# Patient Record
Sex: Male | Born: 1965 | Race: White | Hispanic: No | Smoking: Never smoker
Health system: Southern US, Community
[De-identification: ages and names within clinical notes are randomized; demographics above are authoritative.]

## PROBLEM LIST (undated history)

## (undated) DIAGNOSIS — I82409 Acute embolism and thrombosis of unspecified deep veins of unspecified lower extremity: Secondary | ICD-10-CM

## (undated) DIAGNOSIS — K219 Gastro-esophageal reflux disease without esophagitis: Secondary | ICD-10-CM

## (undated) DIAGNOSIS — G4730 Sleep apnea, unspecified: Secondary | ICD-10-CM

## (undated) DIAGNOSIS — J329 Chronic sinusitis, unspecified: Secondary | ICD-10-CM

## (undated) DIAGNOSIS — K449 Diaphragmatic hernia without obstruction or gangrene: Secondary | ICD-10-CM

## (undated) DIAGNOSIS — E119 Type 2 diabetes mellitus without complications: Secondary | ICD-10-CM

## (undated) DIAGNOSIS — Z9889 Other specified postprocedural states: Secondary | ICD-10-CM

## (undated) DIAGNOSIS — T8859XA Other complications of anesthesia, initial encounter: Secondary | ICD-10-CM

## (undated) DIAGNOSIS — C801 Malignant (primary) neoplasm, unspecified: Secondary | ICD-10-CM

## (undated) DIAGNOSIS — R112 Nausea with vomiting, unspecified: Secondary | ICD-10-CM

## (undated) DIAGNOSIS — T4145XA Adverse effect of unspecified anesthetic, initial encounter: Secondary | ICD-10-CM

## (undated) DIAGNOSIS — R7303 Prediabetes: Secondary | ICD-10-CM

## (undated) DIAGNOSIS — I10 Essential (primary) hypertension: Secondary | ICD-10-CM

## (undated) HISTORY — PX: SHOULDER ARTHROSCOPY: SHX128

## (undated) HISTORY — DX: Gastro-esophageal reflux disease without esophagitis: K21.9

## (undated) HISTORY — PX: KNEE ARTHROSCOPY: SUR90

## (undated) HISTORY — DX: Chronic sinusitis, unspecified: J32.9

## (undated) HISTORY — DX: Essential (primary) hypertension: I10

## (undated) HISTORY — PX: JOINT REPLACEMENT: SHX530

## (undated) HISTORY — PX: OTHER SURGICAL HISTORY: SHX169

## (undated) HISTORY — DX: Sleep apnea, unspecified: G47.30

## (undated) HISTORY — DX: Diaphragmatic hernia without obstruction or gangrene: K44.9

---

## 2005-05-21 ENCOUNTER — Emergency Department (HOSPITAL_COMMUNITY): Admission: EM | Admit: 2005-05-21 | Discharge: 2005-05-22 | Payer: Self-pay | Admitting: Emergency Medicine

## 2005-05-22 ENCOUNTER — Inpatient Hospital Stay (HOSPITAL_COMMUNITY): Admission: AD | Admit: 2005-05-22 | Discharge: 2005-05-25 | Payer: Self-pay | Admitting: Otolaryngology

## 2009-11-22 ENCOUNTER — Ambulatory Visit: Payer: Self-pay | Admitting: Family Medicine

## 2009-11-22 DIAGNOSIS — J309 Allergic rhinitis, unspecified: Secondary | ICD-10-CM | POA: Insufficient documentation

## 2009-11-22 DIAGNOSIS — I10 Essential (primary) hypertension: Secondary | ICD-10-CM | POA: Insufficient documentation

## 2009-11-22 DIAGNOSIS — I2699 Other pulmonary embolism without acute cor pulmonale: Secondary | ICD-10-CM | POA: Insufficient documentation

## 2009-11-23 ENCOUNTER — Telehealth: Payer: Self-pay | Admitting: Family Medicine

## 2009-11-29 ENCOUNTER — Telehealth: Payer: Self-pay | Admitting: Family Medicine

## 2009-12-03 ENCOUNTER — Ambulatory Visit: Payer: Self-pay | Admitting: Family Medicine

## 2009-12-03 LAB — CONVERTED CEMR LAB
INR: 1
Prothrombin Time: 12.1 s

## 2009-12-06 ENCOUNTER — Ambulatory Visit: Payer: Self-pay | Admitting: Family Medicine

## 2009-12-06 LAB — CONVERTED CEMR LAB
INR: 1.4
Prothrombin Time: 14.4 s

## 2009-12-08 ENCOUNTER — Telehealth: Payer: Self-pay | Admitting: Family Medicine

## 2009-12-08 ENCOUNTER — Ambulatory Visit: Payer: Self-pay | Admitting: Family Medicine

## 2009-12-08 ENCOUNTER — Encounter: Payer: Self-pay | Admitting: Family Medicine

## 2009-12-08 ENCOUNTER — Encounter (INDEPENDENT_AMBULATORY_CARE_PROVIDER_SITE_OTHER): Payer: Self-pay | Admitting: *Deleted

## 2009-12-08 DIAGNOSIS — I82409 Acute embolism and thrombosis of unspecified deep veins of unspecified lower extremity: Secondary | ICD-10-CM | POA: Insufficient documentation

## 2009-12-08 LAB — CONVERTED CEMR LAB
INR: 2.1
Prothrombin Time: 17.9 s

## 2009-12-13 ENCOUNTER — Ambulatory Visit: Payer: Self-pay | Admitting: Family Medicine

## 2009-12-13 LAB — CONVERTED CEMR LAB
INR: 2.2
Prothrombin Time: 18.2 s

## 2009-12-14 ENCOUNTER — Ambulatory Visit: Payer: Self-pay | Admitting: Pulmonary Disease

## 2009-12-14 DIAGNOSIS — R0989 Other specified symptoms and signs involving the circulatory and respiratory systems: Secondary | ICD-10-CM | POA: Insufficient documentation

## 2009-12-14 DIAGNOSIS — R0609 Other forms of dyspnea: Secondary | ICD-10-CM

## 2009-12-15 ENCOUNTER — Ambulatory Visit: Payer: Self-pay | Admitting: Pulmonary Disease

## 2009-12-15 DIAGNOSIS — G4733 Obstructive sleep apnea (adult) (pediatric): Secondary | ICD-10-CM | POA: Insufficient documentation

## 2009-12-27 ENCOUNTER — Ambulatory Visit: Payer: Self-pay | Admitting: Family Medicine

## 2009-12-27 LAB — CONVERTED CEMR LAB
INR: 3.6
Prothrombin Time: 23.1 s

## 2010-01-10 ENCOUNTER — Ambulatory Visit: Payer: Self-pay | Admitting: Family Medicine

## 2010-01-10 LAB — CONVERTED CEMR LAB: INR: 2.2

## 2010-01-31 ENCOUNTER — Ambulatory Visit: Payer: Self-pay | Admitting: Family Medicine

## 2010-02-01 ENCOUNTER — Telehealth (INDEPENDENT_AMBULATORY_CARE_PROVIDER_SITE_OTHER): Payer: Self-pay | Admitting: *Deleted

## 2010-02-28 ENCOUNTER — Ambulatory Visit: Payer: Self-pay | Admitting: Family Medicine

## 2010-02-28 LAB — CONVERTED CEMR LAB: INR: 1.8

## 2010-03-30 ENCOUNTER — Ambulatory Visit: Payer: Self-pay | Admitting: Family Medicine

## 2010-03-30 LAB — CONVERTED CEMR LAB: INR: 1.5

## 2010-04-13 ENCOUNTER — Ambulatory Visit: Payer: Self-pay | Admitting: Family Medicine

## 2010-04-13 LAB — CONVERTED CEMR LAB: INR: 1.9

## 2010-05-09 ENCOUNTER — Ambulatory Visit: Payer: Self-pay | Admitting: Family Medicine

## 2010-05-09 LAB — CONVERTED CEMR LAB: INR: 2

## 2010-06-15 ENCOUNTER — Ambulatory Visit: Payer: Self-pay | Admitting: Family Medicine

## 2010-09-18 LAB — CONVERTED CEMR LAB
ALT: 24 units/L (ref 0–53)
AST: 19 units/L (ref 0–37)
Albumin: 3.8 g/dL (ref 3.5–5.2)
Alkaline Phosphatase: 65 units/L (ref 39–117)
BUN: 18 mg/dL (ref 6–23)
Basophils Absolute: 0.1 10*3/uL (ref 0.0–0.1)
Basophils Relative: 1.1 % (ref 0.0–3.0)
Bilirubin Urine: NEGATIVE
Bilirubin, Direct: 0.1 mg/dL (ref 0.0–0.3)
Blood in Urine, dipstick: NEGATIVE
CO2: 27 meq/L (ref 19–32)
Calcium: 9.1 mg/dL (ref 8.4–10.5)
Chloride: 110 meq/L (ref 96–112)
Cholesterol: 211 mg/dL — ABNORMAL HIGH (ref 0–200)
Creatinine, Ser: 0.9 mg/dL (ref 0.4–1.5)
Direct LDL: 138 mg/dL
Eosinophils Absolute: 0.4 10*3/uL (ref 0.0–0.7)
Eosinophils Relative: 9.1 % — ABNORMAL HIGH (ref 0.0–5.0)
GFR calc non Af Amer: 103.33 mL/min (ref >60)
Glucose, Bld: 110 mg/dL — ABNORMAL HIGH (ref 70–99)
Glucose, Urine, Semiquant: NEGATIVE
HCT: 34.1 % — ABNORMAL LOW (ref 39.0–52.0)
HDL: 56.1 mg/dL (ref >39.00)
Hemoglobin: 11.6 g/dL — ABNORMAL LOW (ref 13.0–17.0)
INR: 2.1
Ketones, urine, test strip: NEGATIVE
Lymphocytes Relative: 33.9 % (ref 12.0–46.0)
Lymphs Abs: 1.6 10*3/uL (ref 0.7–4.0)
MCHC: 34 g/dL (ref 30.0–36.0)
MCV: 82.4 fL (ref 78.0–100.0)
Monocytes Absolute: 0.5 10*3/uL (ref 0.1–1.0)
Monocytes Relative: 9.6 % (ref 3.0–12.0)
Neutro Abs: 2.2 10*3/uL (ref 1.4–7.7)
Neutrophils Relative %: 46.3 % (ref 43.0–77.0)
Nitrite: NEGATIVE
Platelets: 218 10*3/uL (ref 150.0–400.0)
Potassium: 4.4 meq/L (ref 3.5–5.1)
Protein, U semiquant: NEGATIVE
RBC: 4.14 M/uL — ABNORMAL LOW (ref 4.22–5.81)
RDW: 15.4 % — ABNORMAL HIGH (ref 11.5–14.6)
Sodium: 144 meq/L (ref 135–145)
Specific Gravity, Urine: 1.025
TSH: 1.06 microintl units/mL (ref 0.35–5.50)
Total Bilirubin: 0.5 mg/dL (ref 0.3–1.2)
Total CHOL/HDL Ratio: 4
Total Protein: 6.6 g/dL (ref 6.0–8.3)
Triglycerides: 103 mg/dL (ref 0.0–149.0)
Urobilinogen, UA: NEGATIVE
VLDL: 20.6 mg/dL (ref 0.0–40.0)
WBC Urine, dipstick: NEGATIVE
WBC: 4.8 10*3/uL (ref 4.5–10.5)
pH: 5.5

## 2010-09-20 NOTE — Assessment & Plan Note (Signed)
Summary: pt/cjr  Nurse Visit   Allergies: No Known Drug Allergies Laboratory Results   Blood Tests     PT: 18.2 s   (Normal Range: 10.6-13.4)  INR: 2.2   (Normal Range: 0.88-1.12   Therap INR: 2.0-3.5) Comments: Rita Ohara  December 13, 2009 2:34 PM     Orders Added: 1)  Est. Patient Level I [99211] 2)  Protime [16109UE]    ANTICOAGULATION RECORD PREVIOUS REGIMEN & LAB RESULTS Anticoagulation Diagnosis:  v5861,v58.83,453.41 on  12/03/2009 Previous INR Goal Range:  2.0-3.0 on  12/03/2009 Previous INR:  1.4 on  12/06/2009 Previous Coumadin Dose(mg):  7.5mg  for 3 day on  12/03/2009 Previous Regimen:  15mg . today and tomorrow on  12/06/2009 Previous Coagulation Comments:  Patiennt has been taking lovevox shots. on  12/03/2009  NEW REGIMEN & LAB RESULTS Current INR: 2.2 Regimen: same  Repeat testing in: 2 weeks  Anticoagulation Visit Questionnaire Coumadin dose missed/changed:  No Abnormal Bleeding Symptoms:  No  Any diet changes including alcohol intake, vegetables or greens since the last visit:  No Any illnesses or hospitalizations since the last visit:  No Any signs of clotting since the last visit (including chest discomfort, dizziness, shortness of breath, arm tingling, slurred speech, swelling or redness in leg):  No  MEDICATIONS ZEGERID 20-1100 MG CAPS (OMEPRAZOLE-SODIUM BICARBONATE) take one tab once daily ATENOLOL 100 MG TABS (ATENOLOL) take half tab once daily LYRICA 100 MG CAPS (PREGABALIN) take one tab every 8 hours STOOL SOFTENER 100 MG CAPS (DOCUSATE SODIUM) once daily OMEPRAZOLE 20 MG CPDR (OMEPRAZOLE) take one tab once daily before meals ENOXAPARIN SODIUM 100 MG/ML SOLN (ENOXAPARIN SODIUM) inject 1ml every 12 hours CIPROFLOXACIN HCL 750 MG TABS (CIPROFLOXACIN HCL) take one tab every 12 hours ACETAMINOPHEN 650 MG CR-TABS (ACETAMINOPHEN) as needed ZOLPIDEM TARTRATE 10 MG TABS (ZOLPIDEM TARTRATE) take one tab at bedtime VICODIN ES 7.5-750 MG TABS  (HYDROCODONE-ACETAMINOPHEN) 1 or 2 at bedtime as needed COUMADIN 10 MG TABS (WARFARIN SODIUM) take one tab by mouth once daily

## 2010-09-20 NOTE — Assessment & Plan Note (Signed)
Summary: NEW PT OV//CCM   Vital Signs:  Patient profile:   45 year old male Height:      72 inches Weight:      225 pounds BMI:     30.63 Temp:     98.8 degrees F oral BP sitting:   104 / 70  (left arm)  Vitals Entered By: Kern Reap CMA Duncan Dull) (November 22, 2009 11:50 AM) CC: new to establish Is Patient Diabetic? No   CC:  new to establish.  History of Present Illness: Andrew Gaines is a 45 year old, married male, nonsmoker, who comes in today accompanied by his wife having recently been discharged from [facility] following a pulmonary embolus.  He's had multiple surgeries on his right knee.  He then had a torn cartilage on his left knee.  He had surgery at [facility] and did well until one week postop.  When his knee swelled.  He went back to [facility] they drained it.  It was clear.  Despite that they start him on oral antibiotics.  After we the swelling recurred.  He went back they drained it.  There was pus.  postop open irrigation of his left knee.  He developed a tachycardia.  CT scan showed a PE.Marland Kitchen  A scan showed a clot in the right popliteal fossa.  Marland Kitchen  He was discharged two weeks ago and is here for follow-up.  Is currently on Lovenox b.i.d. because they could not adjust his protime since he was on Cipro.  His Cipro is due to stop on next Monday.  This PICC line was pulled yesterday.  he takes Dilaudid, one tablet at bedtime for severe pain however, does not need it during the day.  He also has some glucose intolerance during his hospital stay, however, hemoglobin A1c was 6.5%.  he also has sleep apnea, and would like a evaluation  Preventive Screening-Counseling & Management  Alcohol-Tobacco     Smoking Status: never  Hep-HIV-STD-Contraception     Dental Visit-last 6 months yes      Drug Use:  no.    Allergies (verified): No Known Drug Allergies  Past History:  Past medical, surgical, family and social histories (including risk factors) reviewed, and no changes noted (except as noted  below).  Past Medical History: left knee pain right knee pain  GERD Allergic rhinitis Hypertension  Past Surgical History: left knee arthroscope right knee replacement right knee arthroscope right shoulder  right knee HTO  Family History: Reviewed history and no changes required. Father: HTN Mother: hyperglycemic Siblings: 1 sister - healthy Children: 1 daughter - healthy  Social History: Reviewed history and no changes required. Occupation:sales Married Never Smoked Alcohol use-yes Drug use-no Smoking Status:  never Drug Use:  no Dental Care w/in 6 mos.:  yes  Review of Systems      See HPI  Physical Exam  General:  Well-developed,well-nourished,in no acute distress; alert,appropriate and cooperative throughout examination   Impression & Recommendations:  Problem # 1:  OTHER POSTOPERATIVE INFECTION NEC (ICD-998.59) Assessment New  Problem # 2:  PE (ICD-415.19) Assessment: New  His updated medication list for this problem includes:    Enoxaparin Sodium 100 Mg/ml Soln (Enoxaparin sodium) ..... Inject 1ml every 12 hours  Complete Medication List: 1)  Zegerid 20-1100 Mg Caps (Omeprazole-sodium bicarbonate) .... Take one tab once daily 2)  Atenolol 100 Mg Tabs (Atenolol) .... Take half tab once daily 3)  Lyrica 100 Mg Caps (Pregabalin) .... Take one tab every 8 hours 4)  Stool  Softener 100 Mg Caps (Docusate sodium) .... Once daily 5)  Omeprazole 20 Mg Cpdr (Omeprazole) .... Take one tab once daily before meals 6)  Enoxaparin Sodium 100 Mg/ml Soln (Enoxaparin sodium) .... Inject 1ml every 12 hours 7)  Ciprofloxacin Hcl 750 Mg Tabs (Ciprofloxacin hcl) .... Take one tab every 12 hours 8)  Acetaminophen 650 Mg Cr-tabs (Acetaminophen) .... As needed 9)  Zolpidem Tartrate 10 Mg Tabs (Zolpidem tartrate) .... Take one tab at bedtime 10)  Vicodin Es 7.5-750 Mg Tabs (Hydrocodone-acetaminophen) .Marland Kitchen.. 1 or 2 at bedtime as needed  Other Orders: Pulmonary Referral  (Pulmonary)  Patient Instructions: 1)  begin Coumadin next Tuesday evening, 7.5 mg nightly.  Return on Friday for a ProTime. 2)  Continue your current medications. 3)  We will switch to Vicodin. 4)  Also set up a time in June for a complete medical examination Prescriptions: VICODIN ES 7.5-750 MG TABS (HYDROCODONE-ACETAMINOPHEN) 1 or 2 at bedtime as needed  #50 x 1   Entered and Authorized by:   Roderick Pee MD   Signed by:   Roderick Pee MD on 11/22/2009   Method used:   Print then Give to Patient   RxID:   [id number]

## 2010-09-20 NOTE — Progress Notes (Signed)
Summary: vicodin  Phone Note Call from Patient Call back at [phone]   Summary of Call: Vicodin says 1-2 at hs.  Can I use it when I go to PT?  I don't want the pain to get ahead of me.  Also how many hours between doses?  I f I take 1 at 10, can I take one at 2 and one at 6?  [facility] [facility] [address] Initial call taken by: Rudy Jew, RN,  November 23, 2009 1:24 PM  Follow-up for Phone Call        1/2 to 1 tabs q 4hr. as needed  Follow-up by: Roderick Pee MD,  November 23, 2009 1:33 PM  Additional Follow-up for Phone Call Additional follow up Details #1::        Phone Call Completed Additional Follow-up by: Rudy Jew, RN,  November 23, 2009 2:11 PM    New/Updated Medications: VICODIN ES 7.5-750 MG TABS (HYDROCODONE-ACETAMINOPHEN) 1 or 2 at bedtime as needed

## 2010-09-20 NOTE — Assessment & Plan Note (Signed)
Summary: PT//CCM/PT RESCD TO THIS DATE//CCM  Nurse Visit   Allergies: No Known Drug Allergies Laboratory Results   Blood Tests   Date/Time Received: December 03, 2009 2:10 PM  Date/Time Reported: December 03, 2009 2:10 PM   PT: 12.1 s   (Normal Range: 10.6-13.4)  INR: 1.0   (Normal Range: 0.88-1.12   Therap INR: 2.0-3.5) Comments: Wynona Canes, CMA  December 03, 2009 2:10 PM     Orders Added: 1)  Est. Patient Level I [99211] 2)  Protime [[id number]]  Laboratory Results   Blood Tests     PT: 12.1 s   (Normal Range: 10.6-13.4)  INR: 1.0   (Normal Range: 0.88-1.12   Therap INR: 2.0-3.5) Comments: Wynona Canes, CMA  December 03, 2009 2:10 PM       ANTICOAGULATION RECORD       NEW REGIMEN & LAB RESULTS Anticoag. Dx: v5861,v58.83,453.41 Current INR Goal Range: 2.0-3.0 Current INR: 1.0 Current Coumadin Dose(mg): 7.5mg  for 3 day Regimen: 2 of 7.5mg  today then 7.5mg  on sat & sun Coagulation Comments: Patiennt has been taking lovevox shots.      Repeat testing in: Monday MEDICATIONS ZEGERID 20-1100 MG CAPS (OMEPRAZOLE-SODIUM BICARBONATE) take one tab once daily ATENOLOL 100 MG TABS (ATENOLOL) take half tab once daily LYRICA 100 MG CAPS (PREGABALIN) take one tab every 8 hours STOOL SOFTENER 100 MG CAPS (DOCUSATE SODIUM) once daily OMEPRAZOLE 20 MG CPDR (OMEPRAZOLE) take one tab once daily before meals ENOXAPARIN SODIUM 100 MG/ML SOLN (ENOXAPARIN SODIUM) inject 1ml every 12 hours CIPROFLOXACIN HCL 750 MG TABS (CIPROFLOXACIN HCL) take one tab every 12 hours ACETAMINOPHEN 650 MG CR-TABS (ACETAMINOPHEN) as needed ZOLPIDEM TARTRATE 10 MG TABS (ZOLPIDEM TARTRATE) take one tab at bedtime VICODIN ES 7.5-750 MG TABS (HYDROCODONE-ACETAMINOPHEN) 1 or 2 at bedtime as needed   Anticoagulation Visit Questionnaire      Coumadin dose missed/changed:  No      Abnormal Bleeding Symptoms:  No   Any diet changes including alcohol intake, vegetables or greens since the last visit:   No Any illnesses or hospitalizations since the last visit:  No Any signs of clotting since the last visit (including chest discomfort, dizziness, shortness of breath, arm tingling, slurred speech, swelling or redness in leg):  No

## 2010-09-20 NOTE — Progress Notes (Signed)
Summary: coumadin rx  Phone Note Refill Request Message from:  Fax from Pharmacy on December 08, 2009 11:53 AM     New/Updated Medications: COUMADIN 10 MG TABS (WARFARIN SODIUM) take one tab by mouth once daily Prescriptions: COUMADIN 10 MG TABS (WARFARIN SODIUM) take one tab by mouth once daily  #90 x 2   Entered by:   Kern Reap CMA (AAMA)   Authorized by:   Roderick Pee MD   Signed by:   Kern Reap CMA (AAMA) on 12/08/2009   Method used:   [facility]        [phone] ([facility]ail[facility]       [address] [place], [place]  [address]       Ph: [phone] or [phone]       Fax: [phone]   RxID:   [id number]

## 2010-09-20 NOTE — Assessment & Plan Note (Signed)
Summary: pt/njr  Nurse Visit   Allergies: No Known Drug Allergies Laboratory Results   Blood Tests      INR: 1.8   (Normal Range: 0.88-1.12   Therap INR: 2.0-3.5) Comments: Rita Ohara  February 28, 2010 1:20 PM     Orders Added: 1)  Est. Patient Level I [16109] 2)  Protime [[id number]]   ANTICOAGULATION RECORD PREVIOUS REGIMEN & LAB RESULTS Anticoagulation Diagnosis:  v58.61,v58.83,453.41 on  12/08/2009 Previous INR Goal Range:  2.0-3.0 on  12/08/2009 Previous INR:  2.1 on  01/31/2010 Previous Coumadin Dose(mg):  7.5mg  qd on  01/31/2010 Previous Regimen:  same on  01/10/2010 Previous Coagulation Comments:  Patiennt has been taking lovevox shots. on  12/03/2009  NEW REGIMEN & LAB RESULTS Current INR: 1.8 Regimen: same  Repeat testing in: 4 weeks  Anticoagulation Visit Questionnaire Coumadin dose missed/changed:  Yes Abnormal Bleeding Symptoms:  No  Any diet changes including alcohol intake, vegetables or greens since the last visit:  No Any illnesses or hospitalizations since the last visit:  No Any signs of clotting since the last visit (including chest discomfort, dizziness, shortness of breath, arm tingling, slurred speech, swelling or redness in leg):  No  MEDICATIONS ATENOLOL 100 MG TABS (ATENOLOL) take half tab once daily ACETAMINOPHEN 650 MG CR-TABS (ACETAMINOPHEN) as needed * COUMADIN 7.5  MG TABS (WARFARIN SODIUM) take one tab by mouth once daily

## 2010-09-20 NOTE — Assessment & Plan Note (Signed)
Summary: pt/cjr  Nurse Visit   Allergies: No Known Drug Allergies Laboratory Results   Blood Tests      INR: 1.9   (Normal Range: 0.88-1.12   Therap INR: 2.0-3.5) Comments: Rita Ohara  April 13, 2010 2:21 PM     Orders Added: 1)  Est. Patient Level I [99211] 2)  Protime [[id number]]   ANTICOAGULATION RECORD PREVIOUS REGIMEN & LAB RESULTS Anticoagulation Diagnosis:  v58.61,v58.83,453.41 on  12/08/2009 Previous INR Goal Range:  2.0-3.0 on  12/08/2009 Previous INR:  1.5 on  03/30/2010 Previous Coumadin Dose(mg):  Take an extra half mg today then 7.5mg  qd  on  03/30/2010 Previous Regimen:  same on  02/28/2010 Previous Coagulation Comments:  Patiennt has been taking lovevox shots. on  12/03/2009  NEW REGIMEN & LAB RESULTS Current INR: 1.9 Regimen: same Coagulation Comments: Dr. Caryl Never approved Repeat testing in: 4 weeks  Anticoagulation Visit Questionnaire Coumadin dose missed/changed:  No Abnormal Bleeding Symptoms:  No  Any diet changes including alcohol intake, vegetables or greens since the last visit:  No Any illnesses or hospitalizations since the last visit:  No Any signs of clotting since the last visit (including chest discomfort, dizziness, shortness of breath, arm tingling, slurred speech, swelling or redness in leg):  No  MEDICATIONS ATENOLOL 100 MG TABS (ATENOLOL) take half tab once daily ACETAMINOPHEN 650 MG CR-TABS (ACETAMINOPHEN) as needed * COUMADIN 7.5  MG TABS (WARFARIN SODIUM) take one tab by mouth once daily

## 2010-09-20 NOTE — Assessment & Plan Note (Signed)
Summary: Follow up/ns/cb    Other Orders: Protime ([id number]) Fingerstick [phone])  Laboratory Results   Blood Tests   Date/Time Recieved: December 08, 2009 9:54 AM  Date/Time Reported: December 08, 2009 9:54 AM   PT: 17.9 s   (Normal Range: 10.6-13.4)  INR: 2.1   (Normal Range: 0.88-1.12   Therap INR: 2.0-3.5) Comments: Wynona Canes, CMA  December 08, 2009 9:54 AM        ANTICOAGULATION RECORD       NEW REGIMEN & LAB RESULTS Anticoag. Dx: v58.61,v58.83,453.41 Current INR Goal Range: 2.0-3.0 Current INR: 2.1 Current Coumadin Dose(mg): 15mg  only on monday & tuesday Regimen: 10mg  qd       Repeat testing in: Monday  Anticoagulation Visit Questionnaire      Coumadin dose missed/changed:  No      Abnormal Bleeding Symptoms:  No   Any diet changes including alcohol intake, vegetables or greens since the last visit:  No Any illnesses or hospitalizations since the last visit:  No Any signs of clotting since the last visit (including chest discomfort, dizziness, shortness of breath, arm tingling, slurred speech, swelling or redness in leg):  No

## 2010-09-20 NOTE — Assessment & Plan Note (Signed)
Summary: pt/njr/pt rsc/cjr  Nurse Visit   Patient Instructions: 1)  take one Coumadin daily until the bottle is empty, then stop the Coumadin, then begin aspirin one daily forever.  Return p.r.n.   Allergies: No Known Drug Allergies Laboratory Results   Blood Tests      INR: 2.0   (Normal Range: 0.88-1.12   Therap INR: 2.0-3.5) Comments: Rita Ohara  May 09, 2010 2:18 PM     Orders Added: 1)  Est. Patient Level I [99211] 2)  Protime [[id number]]   ANTICOAGULATION RECORD PREVIOUS REGIMEN & LAB RESULTS Anticoagulation Diagnosis:  v58.61,v58.83,453.41 on  12/08/2009 Previous INR Goal Range:  2.0-3.0 on  12/08/2009 Previous INR:  1.9 on  04/13/2010 Previous Coumadin Dose(mg):  Take an extra half mg today then 7.5mg  qd  on  03/30/2010 Previous Regimen:  same on  04/13/2010 Previous Coagulation Comments:  Dr. Caryl Never approved on  04/13/2010  NEW REGIMEN & LAB RESULTS Current INR: 2.0 Regimen: same  Repeat testing in: 4 weeks  Anticoagulation Visit Questionnaire Coumadin dose missed/changed:  No Abnormal Bleeding Symptoms:  No  Any diet changes including alcohol intake, vegetables or greens since the last visit:  No Any illnesses or hospitalizations since the last visit:  No Any signs of clotting since the last visit (including chest discomfort, dizziness, shortness of breath, arm tingling, slurred speech, swelling or redness in leg):  No  MEDICATIONS ATENOLOL 100 MG TABS (ATENOLOL) take half tab once daily ACETAMINOPHEN 650 MG CR-TABS (ACETAMINOPHEN) as needed * COUMADIN 7.5  MG TABS (WARFARIN SODIUM) take one tab by mouth once daily

## 2010-09-20 NOTE — Assessment & Plan Note (Signed)
Summary: cough/njr   Vital Signs:  Patient profile:   45 year old male Weight:      241 pounds Temp:     98.5 degrees F oral BP sitting:   110 / 80  (left arm) Cuff size:   regular  Vitals Entered By: Kern Reap CMA Duncan Dull) (June 15, 2010 10:41 AM) CC: cough Is Patient Diabetic? No Pain Assessment Patient in pain? no        Primary Care Provider:  Tawanna Cooler  CC:  cough.  History of Present Illness: Andrew Gaines is a 45 year old male, who comes in with a 4-day history of cough.  He states 4 days ago he developed a cough and fever and chills.  The fever and chills last one and one away and the cough persists.  He said no previous history of lung disease.  He is a nonsmoker.  Review of systems negative  Allergies (verified): No Known Drug Allergies  Social History: Reviewed history from 11/22/2009 and no changes required. 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 No apparent alopecia or balding. Eyes:  No corneal or conjunctival inflammation noted. EOMI. Perrla. Funduscopic exam benign, without hemorrhages, exudates or papilledema. Vision grossly normal. Ears:  External ear exam shows no significant lesions or deformities.  Otoscopic examination reveals clear canals, tympanic membranes are intact bilaterally without bulging, retraction, inflammation or discharge. Hearing is grossly normal bilaterally. Nose:  External nasal examination shows no deformity or inflammation. Nasal mucosa are pink and moist without lesions or exudates. Mouth:  Oral mucosa and oropharynx without lesions or exudates.  Teeth in good repair. Neck:  No deformities, masses, or tenderness noted. Chest Wall:  No deformities, masses, tenderness or  gynecomastia noted. Lungs:  Normal respiratory effort, chest expands symmetrically. Lungs are clear to auscultation, no crackles or wheezes.   Problems:  Medical Problems Added: 1)  Dx of Viral Infection-unspec  (ICD-079.99)  Impression & Recommendations:  Problem # 1:  VIRAL INFECTION-UNSPEC (ICD-079.99) Assessment New  His updated medication list for this problem includes:    Acetaminophen 650 Mg Cr-tabs (Acetaminophen) .Marland Kitchen... As needed    Hydromet 5-1.5 Mg/34ml Syrp (Hydrocodone-homatropine) .Marland Kitchen... 1/2 to 1 tsp three times a day as needed  Complete Medication List: 1)  Atenolol 100 Mg Tabs (Atenolol) .... Take half tab once daily 2)  Acetaminophen 650 Mg Cr-tabs (Acetaminophen) .... As needed 3)  Hydromet 5-1.5 Mg/48ml Syrp (Hydrocodone-homatropine) .... 1/2 to 1 tsp three times a day as needed  Patient Instructions: 1)  drink 30 ounces of water daily. 2)  Remember to take an aspirin tablet daily. 3)  Hydromet one half to 1 teaspoon 3 times a day as needed for cough.  Return p.r.n. Prescriptions: HYDROMET 5-1.5 MG/5ML SYRP (HYDROCODONE-HOMATROPINE) 1/2 to 1 tsp three times a day as needed  #8oz x `1   Entered and Authorized by:   Roderick Pee MD   Signed by:   Roderick Pee MD on 06/15/2010   Method used:   Print then Give to Patient   RxID:   [id number]    Orders Added: 1)  Est. Patient Level III [62952]

## 2010-09-20 NOTE — Assessment & Plan Note (Signed)
Summary: pt//ccm  Nurse Visit   Allergies: No Known Drug Allergies Laboratory Results   Blood Tests     PT: 14.4 s   (Normal Range: 10.6-13.4)  INR: 1.4   (Normal Range: 0.88-1.12   Therap INR: 2.0-3.5) Comments: Rita Ohara  December 06, 2009 2:04 PM     Orders Added: 1)  Est. Patient Level I [99211] 2)  Protime [[id number]]   ANTICOAGULATION RECORD PREVIOUS REGIMEN & LAB RESULTS Anticoagulation Diagnosis:  v5861,v58.83,453.41 on  12/03/2009 Previous INR Goal Range:  2.0-3.0 on  12/03/2009 Previous INR:  1.0 on  12/03/2009 Previous Coumadin Dose(mg):  7.5mg  for 3 day on  12/03/2009 Previous Regimen:  2 of 7.5mg  today then 7.5mg  on sat & sun on  12/03/2009 Previous Coagulation Comments:  Patiennt has been taking lovevox shots. on  12/03/2009  NEW REGIMEN & LAB RESULTS Current INR: 1.4 Regimen: 15mg . today and tomorrow  Repeat testing in: Wednesday  Anticoagulation Visit Questionnaire Coumadin dose missed/changed:  No Abnormal Bleeding Symptoms:  No  Any diet changes including alcohol intake, vegetables or greens since the last visit:  No Any illnesses or hospitalizations since the last visit:  No Any signs of clotting since the last visit (including chest discomfort, dizziness, shortness of breath, arm tingling, slurred speech, swelling or redness in leg):  No  MEDICATIONS ZEGERID 20-1100 MG CAPS (OMEPRAZOLE-SODIUM BICARBONATE) take one tab once daily ATENOLOL 100 MG TABS (ATENOLOL) take half tab once daily LYRICA 100 MG CAPS (PREGABALIN) take one tab every 8 hours STOOL SOFTENER 100 MG CAPS (DOCUSATE SODIUM) once daily OMEPRAZOLE 20 MG CPDR (OMEPRAZOLE) take one tab once daily before meals ENOXAPARIN SODIUM 100 MG/ML SOLN (ENOXAPARIN SODIUM) inject 1ml every 12 hours CIPROFLOXACIN HCL 750 MG TABS (CIPROFLOXACIN HCL) take one tab every 12 hours ACETAMINOPHEN 650 MG CR-TABS (ACETAMINOPHEN) as needed ZOLPIDEM TARTRATE 10 MG TABS (ZOLPIDEM TARTRATE) take one tab at  bedtime VICODIN ES 7.5-750 MG TABS (HYDROCODONE-ACETAMINOPHEN) 1 or 2 at bedtime as needed

## 2010-09-20 NOTE — Assessment & Plan Note (Signed)
Summary: consult for possible osa   Copy to:  Dr. Kelle Darting Primary Provider/Referring Provider:  Tawanna Cooler  CC:  Sleep consult.Marland Kitchen  History of Present Illness: The pt is a 45y/o male who I have been asked to see for possible osa.  He recently had knee surgery that was complicated by postop PE, and was noted to have apnea and snoring in the recovery room.  He has also been noted to have snoring by his wife, but she has never commented on an abnormal respiratory pattern.  He denies any choking arousals.  He goes to bed around 10-11pm, and arises at 7am to start his day.  His more unrested than rested in the am's, but does not feel overly tired.  He denies any significant daytime sleepiness with periods of inactivity, and has no issues in the evening watching tv or movies.  His weight is neutral from prior to his illness, and his epworth score is one.  Preventive Screening-Counseling & Management  Alcohol-Tobacco     Alcohol drinks/day: <1     Smoking Status: never  Current Medications (verified): 1)  Zegerid 20-1100 Mg Caps (Omeprazole-Sodium Bicarbonate) .... Take One Tab Once Daily As Needed 2)  Atenolol 100 Mg Tabs (Atenolol) .... Take Half Tab Once Daily 3)  Acetaminophen 650 Mg Cr-Tabs (Acetaminophen) .... As Needed 4)  Zolpidem Tartrate 10 Mg Tabs (Zolpidem Tartrate) .... Take One Tab At Bedtime As Needed 5)  Coumadin 10 Mg Tabs (Warfarin Sodium) .... Take One Tab By Mouth Once Daily  Allergies (verified): No Known Drug Allergies  Past History:  Past Medical History:   COUMADIN THERAPY (ICD-V58.61) ENCOUNTER FOR THERAPEUTIC DRUG MONITORING (ICD-V58.83) OTHER POSTOPERATIVE INFECTION NEC (ICD-998.59) PE (ICD-415.19) HYPERTENSION (ICD-401.9) ALLERGIC RHINITIS (ICD-477.9)  Past Surgical History: Reviewed history from 11/22/2009 and no changes required. left knee arthroscope right knee replacement right knee arthroscope right shoulder  right knee HTO  Family  History: Reviewed history from 11/22/2009 and no changes required. Father: HTN Mother: hyperglycemic Siblings: 1 sister - healthy Children: 1 daughter - healthy  Social History: Reviewed history from 11/22/2009 and no changes required. Occupation:sales Married Never Smoked Alcohol use-yes Drug use-no Alcohol drinks/day:  <1  Review of Systems       The patient complains of non-productive cough, acid heartburn, indigestion, nasal congestion/difficulty breathing through nose, sneezing, itching, and joint stiffness or pain.  The patient denies shortness of breath with activity, shortness of breath at rest, productive cough, coughing up blood, chest pain, irregular heartbeats, loss of appetite, weight change, abdominal pain, difficulty swallowing, sore throat, tooth/dental problems, headaches, ear ache, anxiety, depression, hand/feet swelling, rash, change in color of mucus, and fever.    Vital Signs:  Patient profile:   44 year old male Height:      72 inches (182.88 cm) Weight:      229 pounds (104.09 kg) BMI:     31.17 O2 Sat:      99 % on Room air Temp:     98.4 degrees F (36.89 degrees C) oral Pulse rate:   72 / minute BP sitting:   112 / 84  (right arm) Cuff size:   regular  Vitals Entered By: Michel Bickers CMA (December 14, 2009 11:22 AM)  O2 Sat at Rest %:  99 O2 Flow:  Room air CC: Sleep consult.   Physical Exam  General:  ow male in nad Eyes:  PERRLA and EOMI.   Nose:  mild deviated septum to left with narrowing. Mouth:  moderate  elongation of soft palate and uvula Neck:  no jvd, tmg, LN Lungs:  clear to auscultation Heart:  rrr, no mrg Abdomen:  soft and nontender, bs+ Extremities:  no edema noted, no cyanosis pulses intact distally Neurologic:  alert and oriented, moves all 4.   Impression & Recommendations:  Problem # 1:  SNORING (ICD-786.09) the pt has a history of snoring, but it is unclear from his history whether he has frank sleep apnea.  It is very  common for pts to have apnea and snoring in PACU due to after effects of anesthesia and pain meds being administered IV.  His current history is not overly suspicious for osa, and will therefore start with a home study to see if he has clinically significant osa.  He does need to work on weight loss though.  Medications Added to Medication List This Visit: 1)  Zegerid 20-1100 Mg Caps (Omeprazole-sodium bicarbonate) .... Take one tab once daily as needed 2)  Zolpidem Tartrate 10 Mg Tabs (Zolpidem tartrate) .... Take one tab at bedtime as needed  Other Orders: Consultation Level IV (72536) Misc. Referral (Misc. Ref)  Patient Instructions: 1)  will do home study to evaluate for sleep apnea.  I will call when results are available. 2)  work on weight loss.

## 2010-09-20 NOTE — Progress Notes (Signed)
Summary: Lab Report  Phone Note Outgoing Call   Call placed by: Kathrynn Speed CMA,  February 01, 2010 9:28 AM Summary of Call: Lft msg at home & office to call.   Follow-up for Phone Call        Pt called back & is aware of lab results Follow-up by: Kathrynn Speed CMA,  February 01, 2010 9:58 AM

## 2010-09-20 NOTE — Assessment & Plan Note (Signed)
Summary: home study shows very mild osa with AHI 8/hr.   History of Present Illness: Pt's home study reviewed and shows very mild osa with AHI of 8/hr, and desat transiently to 83%.  There was no tachycardia or bradycardia of significance.  I have reviewed the study raw data in detail, and agree with the results.    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  I have called him and discussed in detail, and answered all of his questions.  This is not a health risk for him, and would focus on weight loss and positional therapy since he doesn't feel it is impacting his QOL adversely.  He may also have to factor in marital bliss wrt his wife's sleep.  If he wished to treat more aggressively, he could consider surgery, dental appliance, or cpap.  The pt for now would like to work on conservative therapy, but will discuss further with wife.  Other Orders: Sleep Std Airflow/Heartrate and O2 SAT unattended (16109)  Patient Instructions: 1)  work on weight loss and nonsupine positional therapy. 2)  please call if you would like to consider more aggressive surgery.

## 2010-09-20 NOTE — Progress Notes (Signed)
Summary: lovenox refill  Phone Note From Pharmacy   Summary of Call: patient is requesting generic lovenox is this okay to fill? Initial call taken by: Kern Reap CMA Duncan Dull),  November 29, 2009 4:31 PM  Follow-up for Phone Call        okay per dr Sae Handrich Follow-up by: Kern Reap CMA Duncan Dull),  November 29, 2009 4:48 PM

## 2010-09-20 NOTE — Assessment & Plan Note (Signed)
Summary: pt//ccm  Nurse Visit   Allergies: No Known Drug Allergies Laboratory Results   Blood Tests   Date/Time Received: March 30, 2010 1:09 PM  Date/Time Reported: March 30, 2010 1:09 PM    INR: 1.5   (Normal Range: 0.88-1.12   Therap INR: 2.0-3.5) Comments: Wynona Canes, CMA  March 30, 2010 1:09 PM     Orders Added: 1)  Est. Patient Level I [99211] 2)  Protime [16109UE]  Laboratory Results   Blood Tests      INR: 1.5   (Normal Range: 0.88-1.12   Therap INR: 2.0-3.5) Comments: Wynona Canes, CMA  March 30, 2010 1:09 PM       ANTICOAGULATION RECORD PREVIOUS REGIMEN & LAB RESULTS Anticoagulation Diagnosis:  v58.61,v58.83,453.41 on  12/08/2009 Previous INR Goal Range:  2.0-3.0 on  12/08/2009 Previous INR:  1.8 on  02/28/2010 Previous Coumadin Dose(mg):  7.5mg  qd on  01/31/2010 Previous Regimen:  same on  02/28/2010 Previous Coagulation Comments:  Patiennt has been taking lovevox shots. on  12/03/2009  NEW REGIMEN & LAB RESULTS Current INR: 1.5 Current Coumadin Dose(mg): Take an extra half mg today then 7.5mg  qd  Regimen: same  (no change)       Repeat testing in: 2 weeks MEDICATIONS ATENOLOL 100 MG TABS (ATENOLOL) take half tab once daily ACETAMINOPHEN 650 MG CR-TABS (ACETAMINOPHEN) as needed * COUMADIN 7.5  MG TABS (WARFARIN SODIUM) take one tab by mouth once daily   Anticoagulation Visit Questionnaire      Coumadin dose missed/changed:  No      Abnormal Bleeding Symptoms:  No   Any diet changes including alcohol intake, vegetables or greens since the last visit:  No Any illnesses or hospitalizations since the last visit:  No Any signs of clotting since the last visit (including chest discomfort, dizziness, shortness of breath, arm tingling, slurred speech, swelling or redness in leg):  No

## 2010-09-20 NOTE — Assessment & Plan Note (Signed)
Summary: cpx//ccm   Vital Signs:  Patient profile:   45 year old male Height:      71.5 inches Weight:      230 pounds BMI:     31.75 Temp:     97.7 degrees F BP sitting:   110 / 80  (left arm) Cuff size:   regular  Vitals Entered By: Kathrynn Speed CMA (January 31, 2010 8:26 AM)  CC: Cxp to get labs   Primary Care Provider:  Tawanna Cooler  CC:  Cxp to get labs.  History of Present Illness: Andrew Gaines is a 45 year old, married male, nonsmoker, who comes in today for general medical examination because of underlying hypertension.  He had surgery on his left knee in the spring at [facility] and subsequently developed infection and secondary DVT and PE.  He is on Coumadin 7.5 mg daily PTs are within normal range.  He states he feels 80% back to normal.  He takes atenolol 50 mg daily for hypertension.  BP 110/80.  Review of systems otherwise negative.  Tetanus booster unknown.  Will give him a booster today  Current Medications (verified): 1)  Zegerid 20-1100 Mg Caps (Omeprazole-Sodium Bicarbonate) .... Take One Tab Once Daily As Needed 2)  Atenolol 100 Mg Tabs (Atenolol) .... Take Half Tab Once Daily 3)  Acetaminophen 650 Mg Cr-Tabs (Acetaminophen) .... As Needed 4)  Zolpidem Tartrate 10 Mg Tabs (Zolpidem Tartrate) .... Take One Tab At Bedtime As Needed 5)  Coumadin 7.5  Mg Tabs (Warfarin Sodium) .... Take One Tab By Mouth Once Daily  Allergies (verified): No Known Drug Allergies  Past History:  Past medical, surgical, family and social histories (including risk factors) reviewed, and no changes noted (except as noted below).  Past Medical History: Reviewed history from 12/14/2009 and no changes required.   COUMADIN THERAPY (ICD-V58.61) ENCOUNTER FOR THERAPEUTIC DRUG MONITORING (ICD-V58.83) OTHER POSTOPERATIVE INFECTION NEC (ICD-998.59) PE (ICD-415.19) HYPERTENSION (ICD-401.9) ALLERGIC RHINITIS (ICD-477.9)  Past Surgical History: Reviewed history from 11/22/2009 and no changes  required. left knee arthroscope right knee replacement right knee arthroscope right shoulder  right knee HTO  Family History: Reviewed history from 11/22/2009 and no changes required. Father: HTN Mother: hyperglycemic Siblings: 1 sister - healthy...............screening colonoscopy showed a polyp 2011 Children: 1 daughter - healthy  Social History: Reviewed history from 11/22/2009 and no changes required. 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 No apparent alopecia or balding. Eyes:  No corneal or conjunctival inflammation noted. EOMI. Perrla. Funduscopic exam benign, without hemorrhages, exudates or papilledema. Vision grossly normal. Ears:  External ear exam shows no significant lesions or deformities.  Otoscopic examination reveals clear canals, tympanic membranes are intact bilaterally without bulging, retraction, inflammation or discharge. Hearing is grossly normal bilaterally. Nose:  External nasal examination shows no deformity or inflammation. Nasal mucosa are pink and moist without lesions or exudates. Mouth:  Oral mucosa and oropharynx without lesions or exudates.  Teeth in good repair. Neck:  No deformities, masses, or tenderness noted. Chest Wall:  No deformities, masses, tenderness or gynecomastia noted. Breasts:  No masses or gynecomastia noted Lungs:  Normal respiratory effort, chest expands symmetrically. Lungs are clear to auscultation, no crackles or wheezes. Heart:  Normal rate and regular rhythm. S1 and S2 normal without gallop, murmur, click, rub or other extra sounds. Abdomen:  Bowel sounds positive,abdomen soft and non-tender without masses, organomegaly or hernias noted. Rectal:  No external  abnormalities noted. Normal sphincter tone. No rectal masses or tenderness. Genitalia:  Testes bilaterally descended without nodularity, tenderness or masses. No scrotal masses or lesions. No penis lesions or urethral discharge. Prostate:  Prostate gland firm and smooth, no enlargement, nodularity, tenderness, mass, asymmetry or induration. Msk:  No deformity or scoliosis noted of thoracic or lumbar spine.   Pulses:  R and L carotid,radial,femoral,dorsalis pedis and posterior tibial pulses are full and equal bilaterally Extremities:  No clubbing, cyanosis, edema, or deformity noted with normal full range of motion of all joints.   Neurologic:  No cranial nerve deficits noted. Station and gait are normal. Plantar reflexes are down-going bilaterally. DTRs are symmetrical throughout. Sensory, motor and coordinative functions appear intact. Skin:  scar right knee from total knee replacement 2010.  Stab wounds, left knee from previous arthroscopic surgery 2011 Cervical Nodes:  No lymphadenopathy noted Axillary Nodes:  No palpable lymphadenopathy Inguinal Nodes:  No significant adenopathy Psych:  Cognition and judgment appear intact. Alert and cooperative with normal attention span and concentration. No apparent delusions, illusions, hallucinations   Impression & Recommendations:  Problem # 1:  Preventive Health Care (ICD-V70.0) Assessment Unchanged  Problem # 2:  HYPERTENSION (ICD-401.9) Assessment: Improved  His updated medication list for this problem includes:    Atenolol 100 Mg Tabs (Atenolol) .Marland Kitchen... Take half tab once daily  Orders: Venipuncture (65784) TLB-Lipid Panel (80061-LIPID) TLB-BMP (Basic Metabolic Panel-BMET) (80048-METABOL) TLB-Hepatic/Liver Function Pnl (80076-HEPATIC) TLB-TSH (Thyroid Stimulating Hormone) (84443-TSH) TLB-CBC Platelet - w/Differential (85025-CBCD) Prescription Created Electronically [phone]) UA Dipstick w/o Micro (automated)  (81003) EKG w/ Interpretation  (93000)  Problem # 3:  Hx of PULMONARY EMBOLISM (ICD-415.19) Assessment: Improved  Orders: Venipuncture (52841) TLB-Lipid Panel (80061-LIPID) TLB-BMP (Basic Metabolic Panel-BMET) (80048-METABOL) TLB-Hepatic/Liver Function Pnl (80076-HEPATIC) TLB-TSH (Thyroid Stimulating Hormone) (84443-TSH) TLB-CBC Platelet - w/Differential (85025-CBCD) Prescription Created Electronically [phone]) UA Dipstick w/o Micro (automated)  (81003) Protime (10272ZD)  Complete Medication List: 1)  Atenolol 100 Mg Tabs (Atenolol) .... Take half tab once daily 2)  Acetaminophen 650 Mg Cr-tabs (Acetaminophen) .... As needed 3)  Coumadin 7.5 Mg Tabs (warfarin Sodium)  .... Take one tab by mouth once daily  Patient Instructions: 1)  we will put a note in the system to have somebody call you in September to get you set up for a screening colonoscopy because of your family history of colon polyps.  In your sister.  If nobody call if you in September.......  Call me directly 2)  Please schedule a follow-up appointment in 1 year. Prescriptions: ATENOLOL 100 MG TABS (ATENOLOL) take half tab once daily  #50 x 3   Entered and Authorized by:   Roderick Pee MD   Signed by:   Roderick Pee MD on 01/31/2010   Method used:   Print then Give to Patient   RxID:   [id number] ATENOLOL 100 MG TABS (ATENOLOL) take half tab once daily  #50 x 3   Entered and Authorized by:   Roderick Pee MD   Signed by:   Roderick Pee MD on 01/31/2010   Method used:   Electronically to        [facility]  [phone]* (retail)       [address] [place], [place]  [address]       Ph: [phone] or [phone]       Fax:  [phone]   RxID:   [id number]     Laboratory Results   Urine Tests    Routine Urinalysis   Color: yellow Appearance: Clear Glucose: negative   (Normal Range: Negative) Bilirubin: negative   (Normal Range: Negative) Ketone: negative   (Normal Range: Negative) Spec. Gravity: 1.025    (Normal Range: 1.003-1.035) Blood: negative   (Normal Range: Negative) pH: 5.5   (Normal Range: 5.0-8.0) Protein: negative   (Normal Range: Negative) Urobilinogen: negative   (Normal Range: 0-1) Nitrite: negative   (Normal Range: Negative) Leukocyte Esterace: negative   (Normal Range: Negative)    Comments: Rita Ohara  January 31, 2010 11:44 AM   Blood Tests   Date/Time Recieved: January 31, 2010 9:26 AM  Date/Time Reported: January 31, 2010 9:26 AM    INR: 2.1   (Normal Range: 0.88-1.12   Therap INR: 2.0-3.5) Comments: Wynona Canes, CMA  January 31, 2010 9:26 AM       ANTICOAGULATION RECORD PREVIOUS REGIMEN & LAB RESULTS Anticoagulation Diagnosis:  v58.61,v58.83,453.41 on  12/08/2009 Previous INR Goal Range:  2.0-3.0 on  12/08/2009 Previous INR:  2.2 on  01/10/2010 Previous Coumadin Dose(mg):  10mg  qd on  12/27/2009 Previous Regimen:  same on  01/10/2010 Previous Coagulation Comments:  Patiennt has been taking lovevox shots. on  12/03/2009  NEW REGIMEN & LAB RESULTS Current INR: 2.1 Current Coumadin Dose(mg): 7.5mg  qd Regimen: same  (no change)       Repeat testing in: 4 weeks MEDICATIONS ATENOLOL 100 MG TABS (ATENOLOL) take half tab once daily ACETAMINOPHEN 650 MG CR-TABS (ACETAMINOPHEN) as needed * COUMADIN 7.5  MG TABS (WARFARIN SODIUM) take one tab by mouth once daily   Anticoagulation Visit Questionnaire      Coumadin dose missed/changed:  No      Abnormal Bleeding Symptoms:  No   Any diet changes including alcohol intake, vegetables or greens since the last visit:  No Any illnesses or hospitalizations since the last visit:  No Any signs of clotting since the last visit (including chest discomfort, dizziness, shortness of breath, arm tingling, slurred speech, swelling or redness in leg):  No  Laboratory Results   Urine Tests    Routine Urinalysis   Color: yellow Appearance: Clear Glucose: negative   (Normal Range: Negative) Bilirubin: negative    (Normal Range: Negative) Ketone: negative   (Normal Range: Negative) Spec. Gravity: 1.025   (Normal Range: 1.003-1.035) Blood: negative   (Normal Range: Negative) pH: 5.5   (Normal Range: 5.0-8.0) Protein: negative   (Normal Range: Negative) Urobilinogen: negative   (Normal Range: 0-1) Nitrite: negative   (Normal Range: Negative) Leukocyte Esterace: negative   (Normal Range: Negative)    Comments: Rita Ohara  January 31, 2010 11:44 AM   Blood Tests      INR: 2.1   (Normal Range: 0.88-1.12   Therap INR: 2.0-3.5) Comments: Wynona Canes, CMA  January 31, 2010 9:26 AM

## 2010-09-20 NOTE — Assessment & Plan Note (Signed)
Summary: pt//ccm  Nurse Visit   Allergies: No Known Drug Allergies Laboratory Results   Blood Tests      INR: 2.2   (Normal Range: 0.88-1.12   Therap INR: 2.0-3.5) Comments: Rita Ohara  Jan 10, 2010 1:42 PM     Orders Added: 1)  Est. Patient Level I [99211] 2)  Protime [02725DG]   ANTICOAGULATION RECORD PREVIOUS REGIMEN & LAB RESULTS Anticoagulation Diagnosis:  v58.61,v58.83,453.41 on  12/08/2009 Previous INR Goal Range:  2.0-3.0 on  12/08/2009 Previous INR:  3.6 on  12/27/2009 Previous Coumadin Dose(mg):  10mg  qd on  12/27/2009 Previous Regimen:  Hold for 1 day then 7.5mg  qd on  12/27/2009 Previous Coagulation Comments:  Patiennt has been taking lovevox shots. on  12/03/2009  NEW REGIMEN & LAB RESULTS Current INR: 2.2 Regimen: same  Repeat testing in: 4 weeks  Anticoagulation Visit Questionnaire Coumadin dose missed/changed:  No Abnormal Bleeding Symptoms:  No  Any diet changes including alcohol intake, vegetables or greens since the last visit:  No Any illnesses or hospitalizations since the last visit:  No Any signs of clotting since the last visit (including chest discomfort, dizziness, shortness of breath, arm tingling, slurred speech, swelling or redness in leg):  No  MEDICATIONS ZEGERID 20-1100 MG CAPS (OMEPRAZOLE-SODIUM BICARBONATE) take one tab once daily as needed ATENOLOL 100 MG TABS (ATENOLOL) take half tab once daily ACETAMINOPHEN 650 MG CR-TABS (ACETAMINOPHEN) as needed ZOLPIDEM TARTRATE 10 MG TABS (ZOLPIDEM TARTRATE) take one tab at bedtime as needed COUMADIN 10 MG TABS (WARFARIN SODIUM) take one tab by mouth once daily

## 2011-01-06 NOTE — Discharge Summary (Signed)
NAME:  Andrew Gaines, Andrew Gaines NO.:  [id number]   MEDICAL RECORD NO.:  [id number]          PATIENT TYPE:  INP   LOCATION:  5017                         FACILITY:  [facility]   PHYSICIAN:  Kinnie Scales. Annalee Genta, M.D.DATE OF BIRTH:  1965/09/24   DATE OF ADMISSION:  05/22/2005  DATE OF DISCHARGE:  05/25/2005                                 DISCHARGE SUMMARY   ADMISSION/DISCHARGE DIAGNOSES:  1.  Methicillin resistant staph aureus soft tissue infection of the face.  2.  Mild arthritis.  3.  Gastroesophageal reflux.  4.  Hypertension.   CONDITION ON DISCHARGE:  Stable.   DISPOSITION:  The patient is discharged in stable condition in the company  of his family.   DISCHARGE MEDICATIONS:  1.  Doxycycline 100 mg p.o. b.i.d.  2.  Celebrex 200 mg p.o. daily.  3.  Atenolol 100 mg p.o. daily.  4.  Prevacid 30 mg p.o. daily.  5.  Percocet 5/325 1 to 2 p.o. q. 4 to 6 hours p.r.n., dispense 30 without      refills.   HISTORY OF PRESENT ILLNESS:  The patient is a 45 year old white male who was  referred for evaluation of progressive soft tissue swelling in the paranasal  region. He reported a 6 day history, which began with mild discomfort and  began to spread into the soft tissues of the paranasal region and upper lip  with significant swelling and erythema. Examination in the office showed  significant soft tissue swelling and inflammation and given his history and  physical examination with prior antibiotic therapy and no improvement, I  recommended admission to the hospital for intravenous antibiotics and closer  monitoring.   HOSPITAL COURSE:  The patient was admitted to [facility]. Admission  white blood cell count was 9.1 and the patient had a low grade fever. He was  started on intravenous vancomycin through the hospital protocol. Culture and  sensitivity were taken of the upper lip swelling and final culture and  sensitivity were consistent with methicillin resistant  staph aureus. The  patient had an excellent respond to antibiotic therapy and after 2 days of  intravenous antibiotic therapy, noted significant improvement in discomfort  and swelling. A CT scan was obtained, which showed soft tissue density. No  evidence of obvious abscess and possible bony changes in the anterior  maxilla. On the 3rd postoperative day, the patient was afebrile. Symptoms  had nearly resolved. He did have some mild  erythema and tenderness but no significant swelling. He was discharged to  home on oral doxycycline 100 mg p.o. b.i.d. with wound care consisting of  topical antibiotic cream and followup in my office in 6 days or sooner. The  patient was comfortable with the discharge planning and followup as above.           ______________________________  Kinnie Scales. Annalee Genta, M.D.     DLS/MEDQ  D:  [id number]  T:  07/07/2005  Job:  [id number]

## 2011-02-02 ENCOUNTER — Ambulatory Visit: Payer: Self-pay | Admitting: Family Medicine

## 2011-05-17 ENCOUNTER — Other Ambulatory Visit: Payer: Self-pay | Admitting: *Deleted

## 2011-05-17 MED ORDER — ATENOLOL 100 MG PO TABS: 100.0000 mg | ORAL_TABLET | Freq: Every day | ORAL | Status: DC

## 2011-07-17 ENCOUNTER — Other Ambulatory Visit: Payer: Self-pay | Admitting: Family Medicine

## 2011-12-18 ENCOUNTER — Telehealth: Payer: Self-pay | Admitting: Family Medicine

## 2011-12-18 MED ORDER — SCOPOLAMINE 1 MG/3DAYS TD PT72: 1.0000 | MEDICATED_PATCH | TRANSDERMAL | Status: DC

## 2011-12-18 NOTE — Telephone Encounter (Signed)
Pt is going deep sea fishing for 2 day requesting [facility] sickness patches.[facility] [email or web address]

## 2012-01-17 ENCOUNTER — Other Ambulatory Visit: Payer: Self-pay | Admitting: *Deleted

## 2012-01-17 MED ORDER — ATENOLOL 100 MG PO TABS: 100.0000 mg | ORAL_TABLET | Freq: Every day | ORAL | Status: DC

## 2012-03-08 ENCOUNTER — Other Ambulatory Visit: Payer: Self-pay | Admitting: Family Medicine

## 2012-03-21 ENCOUNTER — Telehealth: Payer: Self-pay | Admitting: Family Medicine

## 2012-03-21 NOTE — Telephone Encounter (Signed)
Dr Tawanna Cooler agrees.  Patient will need an office visit.  Tried to call patient but the number has been disconnected.

## 2012-03-21 NOTE — Telephone Encounter (Signed)
Caller: Andrew Gaines/Patient; PCP: Roderick Pee.; CB#: [phone]; ; ; Call regarding Out of Town Has Sinus Infection and Needs a Sulfa antibiotic Per Patient;  He states this is his annual "summer sinus infection" . Onset Tuesday night 03/16/12.  They are at the [facility] on [address]  and he went to [facility] . He states it was 45 minute drive to get to [facility] and then he found out he had  to make an appt and does not want to that it.  Afebrile, +cough occaisonally intermittent green, no sore throat, sinus pressure. Treated with Mucinex and Tylenol sinus. He states it helps two hours but does not get rid of it.  He states the only treatment that works SULFA ANTIBIOTICS.  They have tried multiple medication and sulfa is the only one that works.   He would like medication called [facility]-  [phone]. Emergent s/sx ruled out per Upper Respiratory Infection Protocol with exception of "Productive cough wtih colored sputum" . See Provider in 24 hours.  Reviewed Home Care with patient . Advised  that the office has a policy that they will not fill antibiotics and patients must be seen.  He expressed understanding but would like message to the office regarding his request for antibiotics.  Understanding expressed.

## 2012-03-22 ENCOUNTER — Telehealth: Payer: Self-pay | Admitting: Family Medicine

## 2012-03-22 NOTE — Telephone Encounter (Signed)
°  SEE HX from 03/21/12. Caller: Scott/Patient; PCP: Roderick Pee.; CB#: [phone]; ;Call regarding Sinus Infection.  He is calling today and states the office never called him back yesterday and he wants to know why.  Pt was instructed 03/21/12 that no antibiotic may be call in without an appointment. Pt states he is out of town and states "I'm not asking for Oxycontin I jsut needs a Sulfa drug".  Instructed again no antibiotic may be called in without an office appt.  So if he is out of town would need to go to an Urgent Care or E/R. Pt states he has another Dr. that will call it in for him.

## 2012-03-25 ENCOUNTER — Ambulatory Visit (INDEPENDENT_AMBULATORY_CARE_PROVIDER_SITE_OTHER)
Admission: RE | Admit: 2012-03-25 | Discharge: 2012-03-25 | Disposition: A | Discharge status: Home / Self Care | Payer: PRIVATE HEALTH INSURANCE | Source: Ambulatory Visit | Attending: Family Medicine | Admitting: Family Medicine

## 2012-03-25 ENCOUNTER — Encounter: Payer: Self-pay | Admitting: Family Medicine

## 2012-03-25 ENCOUNTER — Other Ambulatory Visit: Payer: Self-pay | Admitting: *Deleted

## 2012-03-25 ENCOUNTER — Ambulatory Visit (INDEPENDENT_AMBULATORY_CARE_PROVIDER_SITE_OTHER): Payer: PRIVATE HEALTH INSURANCE | Admitting: Family Medicine

## 2012-03-25 VITALS — BP 120/80 | Temp 98.3°F | Wt 253.0 lb

## 2012-03-25 DIAGNOSIS — R51 Headache: Secondary | ICD-10-CM

## 2012-03-25 DIAGNOSIS — J329 Chronic sinusitis, unspecified: Secondary | ICD-10-CM

## 2012-03-25 DIAGNOSIS — R519 Headache, unspecified: Secondary | ICD-10-CM

## 2012-03-25 MED ORDER — CEPHALEXIN 500 MG PO CAPS: 500.0000 mg | ORAL_CAPSULE | Freq: Two times a day (BID) | ORAL | Status: AC

## 2012-03-25 NOTE — Patient Instructions (Addendum)
Go to the main office now for your x-rays  As soon as I get the report I will call you  Stop by the drugstore and get some Claritin......... no D.......... take one now along with some Afrin nasal spray......... one-shot each nostril twice daily

## 2012-03-25 NOTE — Progress Notes (Signed)
  Subjective:    Patient ID: Andrew Gaines, male    DOB: May 14, 1966, 46 y.o.   MRN: [id number]  HPI Andrew Gaines is a 46 year old married male nonsmoker who comes in today for evaluation of 6 days worth of right and left frontal headache  He states he has a sinus infection every August and has been giving antibiotics by another physician  He has a history of allergic rhinitis and no asthma.     Review of Systems ENT and pulmonary review of systems otherwise negative except for a slight unproductive cough no fever no sputum production    Objective:   Physical Exam Well-developed well-nourished male in no acute distress HEENT were negative neck was supple no adenopathy lungs are clear he does have a fair amount of nasal edema 3+ out of 4 and a little septal deviation to the left       Assessment & Plan:  Bilateral frontal headache rule out infection versus allergy plan x-ray of sinuses stat

## 2012-08-30 ENCOUNTER — Other Ambulatory Visit: Payer: Self-pay | Admitting: *Deleted

## 2012-08-30 MED ORDER — ATENOLOL 100 MG PO TABS: 100.0000 mg | ORAL_TABLET | Freq: Every day | ORAL | Status: DC

## 2012-11-26 ENCOUNTER — Other Ambulatory Visit: Payer: PRIVATE HEALTH INSURANCE

## 2012-12-03 ENCOUNTER — Encounter: Payer: PRIVATE HEALTH INSURANCE | Admitting: Family Medicine

## 2013-01-24 ENCOUNTER — Other Ambulatory Visit (INDEPENDENT_AMBULATORY_CARE_PROVIDER_SITE_OTHER): Payer: BC Managed Care – PPO

## 2013-01-24 DIAGNOSIS — Z0000 Encounter for general adult medical examination without abnormal findings: Secondary | ICD-10-CM

## 2013-01-24 LAB — POCT URINALYSIS DIPSTICK
Bilirubin, UA: NEGATIVE
Blood, UA: NEGATIVE
Glucose, UA: NEGATIVE
Ketones, UA: NEGATIVE
Leukocytes, UA: NEGATIVE
Nitrite, UA: NEGATIVE
Protein, UA: NEGATIVE
Spec Grav, UA: 1.025
Urobilinogen, UA: 0.2
pH, UA: 6.5

## 2013-01-24 LAB — LIPID PANEL
Cholesterol: 194 mg/dL (ref 0–200)
HDL: 45.1 mg/dL (ref >39.00)
LDL Cholesterol: 116 mg/dL — ABNORMAL HIGH (ref 0–99)
Total CHOL/HDL Ratio: 4
Triglycerides: 164 mg/dL — ABNORMAL HIGH (ref 0.0–149.0)
VLDL: 32.8 mg/dL (ref 0.0–40.0)

## 2013-01-24 LAB — CBC WITH DIFFERENTIAL/PLATELET
Basophils Absolute: 0.1 10*3/uL (ref 0.0–0.1)
Basophils Relative: 1.1 % (ref 0.0–3.0)
Eosinophils Absolute: 0.5 10*3/uL (ref 0.0–0.7)
Eosinophils Relative: 8.6 % — ABNORMAL HIGH (ref 0.0–5.0)
HCT: 40.8 % (ref 39.0–52.0)
Hemoglobin: 13.5 g/dL (ref 13.0–17.0)
Lymphocytes Relative: 23.7 % (ref 12.0–46.0)
Lymphs Abs: 1.4 10*3/uL (ref 0.7–4.0)
MCHC: 33 g/dL (ref 30.0–36.0)
MCV: 86.2 fl (ref 78.0–100.0)
Monocytes Absolute: 0.5 10*3/uL (ref 0.1–1.0)
Monocytes Relative: 9.2 % (ref 3.0–12.0)
Neutro Abs: 3.3 10*3/uL (ref 1.4–7.7)
Neutrophils Relative %: 57.4 % (ref 43.0–77.0)
Platelets: 215 10*3/uL (ref 150.0–400.0)
RBC: 4.74 Mil/uL (ref 4.22–5.81)
RDW: 15.8 % — ABNORMAL HIGH (ref 11.5–14.6)
WBC: 5.7 10*3/uL (ref 4.5–10.5)

## 2013-01-24 LAB — HEPATIC FUNCTION PANEL
ALT: 34 U/L (ref 0–53)
AST: 25 U/L (ref 0–37)
Albumin: 3.7 g/dL (ref 3.5–5.2)
Alkaline Phosphatase: 63 U/L (ref 39–117)
Bilirubin, Direct: 0.1 mg/dL (ref 0.0–0.3)
Total Bilirubin: 1.1 mg/dL (ref 0.3–1.2)
Total Protein: 6.9 g/dL (ref 6.0–8.3)

## 2013-01-24 LAB — BASIC METABOLIC PANEL
BUN: 17 mg/dL (ref 6–23)
CO2: 23 mEq/L (ref 19–32)
Calcium: 8.9 mg/dL (ref 8.4–10.5)
Chloride: 107 mEq/L (ref 96–112)
Creatinine, Ser: 1.1 mg/dL (ref 0.4–1.5)
GFR: 74.97 mL/min (ref >60.00)
Glucose, Bld: 124 mg/dL — ABNORMAL HIGH (ref 70–99)
Potassium: 4.3 mEq/L (ref 3.5–5.1)
Sodium: 141 mEq/L (ref 135–145)

## 2013-01-24 LAB — TSH: TSH: 1.79 u[IU]/mL (ref 0.35–5.50)

## 2013-01-29 ENCOUNTER — Ambulatory Visit (INDEPENDENT_AMBULATORY_CARE_PROVIDER_SITE_OTHER): Payer: BC Managed Care – PPO | Admitting: Family Medicine

## 2013-01-29 ENCOUNTER — Encounter: Payer: Self-pay | Admitting: Family Medicine

## 2013-01-29 VITALS — BP 120/88 | Temp 98.8°F | Ht 72.0 in | Wt 257.0 lb

## 2013-01-29 DIAGNOSIS — J309 Allergic rhinitis, unspecified: Secondary | ICD-10-CM

## 2013-01-29 DIAGNOSIS — I10 Essential (primary) hypertension: Secondary | ICD-10-CM

## 2013-01-29 DIAGNOSIS — K2100 Gastro-esophageal reflux disease with esophagitis, without bleeding: Secondary | ICD-10-CM | POA: Insufficient documentation

## 2013-01-29 DIAGNOSIS — R1310 Dysphagia, unspecified: Secondary | ICD-10-CM

## 2013-01-29 MED ORDER — LISINOPRIL-HYDROCHLOROTHIAZIDE 10-12.5 MG PO TABS: 1.0000 | ORAL_TABLET | Freq: Every day | ORAL | Status: DC

## 2013-01-29 NOTE — Patient Instructions (Addendum)
Nothing to eat or drink for 3 hours prior to bedtime  Avoid caffeine peppermint and alcohol,,,,,,,,,,,,,,drink lots of water with your meals  OTC Prilosec,,,,,,,,,, one twice daily  We will get u  set up in GI for consult for further evaluation  Stop the Tenormin  Begin lisinopril 10 mg once daily in the morning,,,,,,,,,,,,,side effects cough and hives  Check your blood pressure daily in the morning and return to see me in 3 weeks for followup. Be sure to bring all the data and the device

## 2013-01-29 NOTE — Progress Notes (Signed)
  Subjective:    Patient ID: Andrew Gaines, male    DOB: [birth year], 47 y.o.   MRN: [id number]  HPI Andrew Gaines is a 47 year old male married nonsmoker who comes in today for general physical examination because he has underlying hypertension and some other concerns  He's been on Tenormin 100 mg daily for many years with good blood pressure control however now is having issues with libido and ED and we will switch his medication  He's noticed he has wheezing when he lies down. He had a pulmonary evaluation which he was told with a home sleep study was normal. However his notes indicate that he was told he had sleep apnea. I will investigate that further  He's had a lot of trouble recently with reflux esophagitis and indeed the last couple nights has woken up in the night gagging and coughing and has vomited. He's also having difficulty swallowing. He notices certain foods get stuck and he points to the mid esophageal area as the area where the foods get stuck.   Review of Systems  Constitutional: Negative.   HENT: Negative.   Eyes: Negative.   Respiratory: Negative.   Cardiovascular: Negative.   Gastrointestinal: Negative.   Genitourinary: Negative.   Musculoskeletal: Negative.   Skin: Negative.   Neurological: Negative.   Psychiatric/Behavioral: Negative.        Objective:   Physical Exam  Constitutional: He is oriented to person, place, and time. He appears well-developed and well-nourished.  HENT:  Head: Normocephalic and atraumatic.  Right Ear: External ear normal.  Left Ear: External ear normal.  Nose: Nose normal.  Mouth/Throat: Oropharynx is clear and moist.  Eyes: Conjunctivae and EOM are normal. Pupils are equal, round, and reactive to light.  Neck: Normal range of motion. Neck supple. No JVD present. No tracheal deviation present. No thyromegaly present.  Cardiovascular: Normal rate, regular rhythm, normal heart sounds and intact distal pulses.  Exam reveals no gallop  and no friction rub.   No murmur heard. Pulmonary/Chest: Effort normal and breath sounds normal. No stridor. No respiratory distress. He has no wheezes. He has no rales. He exhibits no tenderness.  Abdominal: Soft. Bowel sounds are normal. He exhibits no distension and no mass. There is no tenderness. There is no rebound and no guarding.  Genitourinary: Rectum normal, prostate normal and penis normal. Guaiac negative stool. No penile tenderness.  Musculoskeletal: Normal range of motion. He exhibits no edema and no tenderness.  Lymphadenopathy:    He has no cervical adenopathy.  Neurological: He is alert and oriented to person, place, and time. He has normal reflexes. No cranial nerve deficit. He exhibits normal muscle tone.  Skin: Skin is warm and dry. No rash noted. No erythema. No pallor.  Psychiatric: He has a normal mood and affect. His behavior is normal. Judgment and thought content normal.   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 He also had a arthroscopic surgery to his left knee. After that he had a DVT and multiple pulmonary emboli which she is recovered from. No venous insufficiency her persistent edema       Assessment & Plan:  Hypertension DC beta blocker start lisinopril BP check daily followup in 3 weeks  Reflux esophagitis with difficulty swallowing,,,,,,,,, see orders

## 2013-02-04 ENCOUNTER — Other Ambulatory Visit: Payer: PRIVATE HEALTH INSURANCE

## 2013-02-10 ENCOUNTER — Telehealth: Payer: Self-pay | Admitting: Family Medicine

## 2013-02-10 NOTE — Telephone Encounter (Signed)
FYI

## 2013-02-10 NOTE — Telephone Encounter (Signed)
Attempted to call back to patient at 13:50 for concern with sx of heart racing/ possible medication side effect. No answer. Left message for patient to call back.

## 2013-02-11 ENCOUNTER — Encounter: Payer: PRIVATE HEALTH INSURANCE | Admitting: Family Medicine

## 2013-02-11 NOTE — Telephone Encounter (Signed)
Patient Information:  Caller Name: Adrean  Phone: [phone]  Patient: Andrew Gaines, Andrew Gaines  Gender: Male  DOB: [birth year]  Age: 47 Years  PCP: [facility])  Office Follow Up:  Does the office need to follow up with this patient?: Yes  Instructions For The Office: FYI: Patient calls to note that he has gone back to old BP medication.  He did not like how he felt taking the new.  RN Note:  Patient calls while traveling to report he has gone back to previous blood pressure medication.  He did not like how new medication made him feel.  He could not bend over without feeling light headed when he straighten up.  Any exertion caused heart to pound-like going up stairs.  Calls to notify provider of action.  BP values noted above in Symptoms.  RN asked patient twice if he was still symptomatic or was having problems that needed to be assessed.  He denied each time.  RN verified twice that this was and informing call of what he's done and twice he said yes.  RN will forward information to office for follow up as needed.  Symptoms  Reason For Call & Symptoms: Switched blood pressure meds about 2 weeks ago and since then he is getting light headed when bending over and straightens up.  With exertion heart pains.  Otherwise is doing well.  BP has been running 110-170/ 77-88.   This morning back on old medicine was 130/91..  Stopped new medicine and has gone back to old medicine today.  Did not like how it was making it feels.  Reviewed Health History In EMR: N/A  Reviewed Medications In EMR: N/A  Reviewed Allergies In EMR: N/A  Reviewed Surgeries / Procedures: N/A  Date of Onset of Symptoms: 02/11/2013  Treatments Tried: Switched back to old blood pressure medication  Treatments Tried Worked: Yes  Guideline(s) Used:  No Protocol Available - Information Only  Disposition Per Guideline:   Home Care  Reason For Disposition Reached:   Information only question and nurse able to  answer  Advice Give Call back for changes or if he continues with symptoms.  Patient Will Follow Care Advice:  YES

## 2013-02-14 ENCOUNTER — Encounter: Payer: Self-pay | Admitting: *Deleted

## 2013-02-19 ENCOUNTER — Ambulatory Visit: Payer: BC Managed Care – PPO | Admitting: Family Medicine

## 2013-02-20 ENCOUNTER — Ambulatory Visit: Payer: BC Managed Care – PPO | Admitting: Gastroenterology

## 2013-02-24 ENCOUNTER — Ambulatory Visit (INDEPENDENT_AMBULATORY_CARE_PROVIDER_SITE_OTHER): Payer: BC Managed Care – PPO | Admitting: Family Medicine

## 2013-02-24 ENCOUNTER — Encounter: Payer: Self-pay | Admitting: Family Medicine

## 2013-02-24 VITALS — BP 120/90 | Temp 97.7°F | Wt 260.0 lb

## 2013-02-24 DIAGNOSIS — I10 Essential (primary) hypertension: Secondary | ICD-10-CM

## 2013-02-24 MED ORDER — LOSARTAN POTASSIUM 50 MG PO TABS: 50.0000 mg | ORAL_TABLET | Freq: Every day | ORAL | Status: DC

## 2013-02-24 NOTE — Progress Notes (Signed)
  Subjective:    Patient ID: Andrew Gaines, male    DOB: 10-Oct-1965, 47 y.o.   MRN: [id number]  HPI Andrew Gaines is a 47 year old male who comes in today for reevaluation of hypertension. We switched him from the beta blocker to lisinopril because of side effects erectile dysfunction. He started taking lisinopril 10-12.5 one tablet daily but he said it made him lightheaded when he bent over. He then the back to the 50 mg of atenolol BP today 120/90   Review of Systems    review of systems otherwise negative Objective:   Physical Exam  Well-developed well-nourished male no acute distress BP right arm sitting position 120/90 pulse 70 and regular      Assessment & Plan:  Hypertension at goal however side effects from beta blocker try Cozaar

## 2013-02-24 NOTE — Patient Instructions (Signed)
Stop the atenolol  Start the Cozaar 50 mg,,,,,,,,,,,, one half tab daily in the morning  If in 2 weeks your blood pressure is not at goal,,,,,,,,, 135/85 her last,,,,,,,,,,,,, increase the dose to a full tablet  Check your blood pressure daily in the morning and return to see me in a month for followup

## 2013-03-05 ENCOUNTER — Encounter: Payer: Self-pay | Admitting: Gastroenterology

## 2013-03-05 ENCOUNTER — Ambulatory Visit (INDEPENDENT_AMBULATORY_CARE_PROVIDER_SITE_OTHER): Payer: BC Managed Care – PPO | Admitting: Gastroenterology

## 2013-03-05 VITALS — BP 132/68 | HR 88 | Ht 72.0 in | Wt 258.8 lb

## 2013-03-05 DIAGNOSIS — K219 Gastro-esophageal reflux disease without esophagitis: Secondary | ICD-10-CM

## 2013-03-05 DIAGNOSIS — Z800 Family history of malignant neoplasm of digestive organs: Secondary | ICD-10-CM

## 2013-03-05 DIAGNOSIS — E669 Obesity, unspecified: Secondary | ICD-10-CM

## 2013-03-05 DIAGNOSIS — R1319 Other dysphagia: Secondary | ICD-10-CM

## 2013-03-05 MED ORDER — DEXLANSOPRAZOLE 60 MG PO CPDR: 60.0000 mg | DELAYED_RELEASE_CAPSULE | Freq: Every day | ORAL | Status: DC

## 2013-03-05 NOTE — Progress Notes (Signed)
History of Present Illness:  This is a 47 year-old Caucasian male who has had over 20 years of acid reflux with periodic treatment with PPI.  He said several episodes of dysphagia which is progressive now over the last 6 months.  He apparently had a meat impaction in 1997 and had an emergency room endoscopy.  His acid reflux symptoms consist of regurgitation and burning substernal chest pain with multiple ENT problems.  He apparently has been tested for sleep apnea and was negative.  Currently is on a soft diet and is taking Prevacid 30 mg a day.  He does not smoke or abuse NSAIDs or alcohol.  He denies a history of hepatitis, pancreatitis, or other hepatobiliary problems.  Has regular bowel movements without melena or hematochezia.  He does have a family history of colon cancer in a grandmother.  I have reviewed this patient's present history, medical and surgical past history, allergies and medications.     ROS:   All systems were reviewed and are negative unless otherwise stated in the HPI.    Physical Exam: Blood pressure 132/68, pulse 88 and regular and weight 258 with a BMI of 35.09. General well developed well nourished patient in no acute distress, appearing their stated age Eyes PERRLA, no icterus, fundoscopic exam per opthamologist Skin no lesions noted Neck supple, no adenopathy, no thyroid enlargement, no tenderness Chest clear to percussion and auscultation Heart no significant murmurs, gallops or rubs noted Abdomen no hepatosplenomegaly masses or tenderness, BS normal.Extremities no acute joint lesions, edema, phlebitis or evidence of cellulitis. Neurologic patient oriented x 3, cranial nerves intact, no focal neurologic deficits noted. Psychological mental status normal and normal affect.  Assessment and plan: Chronic GERD, rule out Barrett's mucosa with associated stricturing or carcinoma.  I placed this patient on a step for dysphagia diet, changed him to Dexilant 60 mg 30  minutes before supper, and scheduled endoscopy with possible biopsies and dilatation.  He is high risk for Barrett's mucosa because of the length of his reflux and his age.  Age 47 you'll definitely need screening colonoscopy with a positive family history of colon cancer.  Antireflux measures reviewed with patient, and patient video shown to patient for his edification.  His continue other medications per primary care.  In the long one, the patient also would benefit from weight loss with a BMI of 35.  No diagnosis found.

## 2013-03-05 NOTE — Patient Instructions (Addendum)
  You have been scheduled for an endoscopy with propofol. Please follow written instructions given to you at your visit today. If you use inhalers (even only as needed), please bring them with you on the day of your procedure. Your physician has requested that you go to www.startemmi.com and enter the access code given to you at your visit today. This web site gives a general overview about your procedure. However, you should still follow specific instructions given to you by our office regarding your preparation for the procedure.  Dysphagia Diet given today please follow level 4 on the diet.  Please stop Prevacid and start Dexilant. Samples given today. If Dexilant works well please call for a prescription _________________________________________________________________________________________                                               We are excited to introduce MyChart, a new best-in-class service that provides you online access to important information in your electronic medical record. We want to make it easier for you to view your health information - all in one secure location - when and where you need it. We expect MyChart will enhance the quality of care and service we provide.  When you register for MyChart, you can:    View your test results.    Request appointments and receive appointment reminders via email.    Request medication renewals.    View your medical history, allergies, medications and immunizations.    Communicate with your physician's office through a password-protected site.    Conveniently print information such as your medication lists.  To find out if MyChart is right for you, please talk to a member of our clinical staff today. We will gladly answer your questions about this free health and wellness tool.  If you are age 49 or older and want a member of your family to have access to your record, you must provide written consent by completing a proxy  form available at our office. Please speak to our clinical staff about guidelines regarding accounts for patients younger than age 49.  As you activate your MyChart account and need any technical assistance, please call the MyChart technical support line at [phone]) [phone]-CHART [phone]) or email your question to [email or web address]. If you email your question(s), please include your name, a return phone number and the best time to reach you.  If you have non-urgent health-related questions, you can send a message to our office through MyChart at [email or web address]. If you have a medical emergency, call 911.  Thank you for using MyChart as your new health and wellness resource!   MyChart licensed from [employer],  [id number]. Patents Pending.

## 2013-03-14 ENCOUNTER — Encounter: Payer: Self-pay | Admitting: Gastroenterology

## 2013-03-14 ENCOUNTER — Ambulatory Visit (AMBULATORY_SURGERY_CENTER): Payer: BC Managed Care – PPO | Admitting: Gastroenterology

## 2013-03-14 VITALS — BP 128/87 | HR 54 | Temp 97.3°F | Resp 17 | Ht 72.0 in | Wt 258.0 lb

## 2013-03-14 DIAGNOSIS — K219 Gastro-esophageal reflux disease without esophagitis: Secondary | ICD-10-CM

## 2013-03-14 DIAGNOSIS — K2270 Barrett's esophagus without dysplasia: Secondary | ICD-10-CM

## 2013-03-14 DIAGNOSIS — R1310 Dysphagia, unspecified: Secondary | ICD-10-CM

## 2013-03-14 MED ORDER — DEXLANSOPRAZOLE 60 MG PO CPDR: 60.0000 mg | DELAYED_RELEASE_CAPSULE | Freq: Every day | ORAL | Status: DC

## 2013-03-14 MED ORDER — SODIUM CHLORIDE 0.9 % IV SOLN: 500.0000 mL | INTRAVENOUS | Status: DC

## 2013-03-14 NOTE — Patient Instructions (Addendum)
Discharge instructions given with verbal understanding. Biopsies taken. Handout on a dilatation diet given. Resume previous medications. YOU HAD AN ENDOSCOPIC PROCEDURE TODAY AT THE [facility]: Refer to the procedure report that was given to you for any specific questions about what was found during the examination.  If the procedure report does not answer your questions, please call your gastroenterologist to clarify.  If you requested that your care partner not be given the details of your procedure findings, then the procedure report has been included in a sealed envelope for you to review at your convenience later.  YOU SHOULD EXPECT: Some feelings of bloating in the abdomen. Passage of more gas than usual.  Walking can help get rid of the air that was put into your GI tract during the procedure and reduce the bloating. If you had a lower endoscopy (such as a colonoscopy or flexible sigmoidoscopy) you may notice spotting of blood in your stool or on the toilet paper. If you underwent a bowel prep for your procedure, then you may not have a normal bowel movement for a few days.  DIET: Your first meal following the procedure should be a light meal and then it is ok to progress to your normal diet.  A half-sandwich or bowl of soup is an example of a good first meal.  Heavy or fried foods are harder to digest and may make you feel nauseous or bloated.  Likewise meals heavy in dairy and vegetables can cause extra gas to form and this can also increase the bloating.  Drink plenty of fluids but you should avoid alcoholic beverages for 24 hours.  ACTIVITY: Your care partner should take you home directly after the procedure.  You should plan to take it easy, moving slowly for the rest of the day.  You can resume normal activity the day after the procedure however you should NOT DRIVE or use heavy machinery for 24 hours (because of the sedation medicines used during the test).    SYMPTOMS TO  REPORT IMMEDIATELY: A gastroenterologist can be reached at any hour.  During normal business hours, 8:30 AM to 5:00 PM Monday through Friday, call [phone].  After hours and on weekends, please call the GI answering service at [phone] who will take a message and have the physician on call contact you.   Following upper endoscopy (EGD)  Vomiting of blood or coffee ground material  New chest pain or pain under the shoulder blades  Painful or persistently difficult swallowing  New shortness of breath  Fever of 100F or higher  Black, tarry-looking stools  FOLLOW UP: If any biopsies were taken you will be contacted by phone or by letter within the next 1-3 weeks.  Call your gastroenterologist if you have not heard about the biopsies in 3 weeks.  Our staff will call the home number listed on your records the next business day following your procedure to check on you and address any questions or concerns that you may have at that time regarding the information given to you following your procedure. This is a courtesy call and so if there is no answer at the home number and we have not heard from you through the emergency physician on call, we will assume that you have returned to your regular daily activities without incident.  SIGNATURES/CONFIDENTIALITY: You and/or your care partner have signed paperwork which will be entered into your electronic medical record.  These signatures attest to the fact that that   the information above on your After Visit Summary has been reviewed and is understood.  Full responsibility of the confidentiality of this discharge information lies with you and/or your care-partner. 

## 2013-03-14 NOTE — Progress Notes (Signed)
Patient did not experience any of the following events: a burn prior to discharge; a fall within the facility; wrong site/side/patient/procedure/implant event; or a hospital transfer or hospital admission upon discharge from the facility. (G8907) Patient did not have preoperative order for IV antibiotic SSI prophylaxis. (G8918)  

## 2013-03-14 NOTE — Op Note (Signed)
[facility] [address] [place], [address]   ENDOSCOPY PROCEDURE REPORT  PATIENT: Andrew, Gaines  MR#: [id number] BIRTHDATE: [birth year] , 46  yrs. old GENDER: Male ENDOSCOPIST:David Hale Bogus, MD, Clementeen Graham REFERRED BY: Roderick Pee, M.D. PROCEDURE DATE:  03/14/2013 PROCEDURE:   EGD w/ biopsy ASA CLASS:    Class II INDICATIONS: History of reflux esophagitis and Dysphagia. MEDICATION: Propofol (Diprivan) 240 mg IV TOPICAL ANESTHETIC:   Cetacaine Spray  DESCRIPTION OF PROCEDURE:   After the risks and benefits of the procedure were explained, informed consent was obtained.  The [id number]  endoscope was introduced through the mouth  and advanced to the second portion of the duodenum .  The instrument was slowly withdrawn as the mucosa was fully examined.      DUODENUM: The duodenal mucosa showed no abnormalities in the bulb and second portion of the duodenum.  STOMACH: The mucosa of the stomach appeared normal. there is a large hiatal hernia present with Sheria Lang erosions in 4 quadrants noted. See pictures for documentation.  ESOPHAGUS: There was evidence of suspected Barrett's esophagus. there is a 5-6 cm area of Barrett's mucosa in the distal esophagus, seeing narrow band imaging exam for documentation.  Multiple mucosal biopsies were obtained.  The patient been extubated, been dilated with a #54 [place] dilator.  There was no heme on the dilator or significant chest pain.   Retroflexed views revealed a large hiatal hernia.    The scope was then withdrawn from the patient and the procedure completed.  COMPLICATIONS: There were no complications.   ENDOSCOPIC IMPRESSION: 1.   The duodenal mucosa showed no abnormalities in the bulb and second portion of the duodenum 2.   The mucosa of the stomach appeared normal but there is a large hiatal hernia with Cameron erosions in the hernia sac 3.   There was evidence of suspected Barrett's  esophagus ,5-6 cm segment without any atypical appearing lesions and no erosions.  I cannot appreciate a definite esophageal stricture, esophageal dilation was performed successfully.  These findings are consistent with chronic gastroesophageal reflux, rule out dysplasia in his area of Barrett's mucosa.  RECOMMENDATIONS: 1.  Await biopsy results 2.  Continue PPI 3. office followup in several weeks' time to go over biopsies and reinforce reflux regime and chronic PPI therapy.  This patient may need surgical correction of his prominent hiatal hernia because of his relatively young age.  However, we will see how he does medically for a period of time before taking this route.   _______________________________ eSigned:  Mardella Layman, MD, [facility] 03/14/2013 3:48 PM   standard discharge   PATIENT NAME:  Andrew, Gaines MR#: [id number]

## 2013-03-14 NOTE — Progress Notes (Signed)
Called to room to assist during endoscopic procedure.  Patient ID and intended procedure confirmed with present staff. Received instructions for my participation in the procedure from the performing physician.  

## 2013-03-14 NOTE — Progress Notes (Signed)
Procedure ends, to recovery, report given and VSS. 

## 2013-03-17 ENCOUNTER — Telehealth: Payer: Self-pay | Admitting: *Deleted

## 2013-03-17 ENCOUNTER — Telehealth: Payer: Self-pay

## 2013-03-17 MED ORDER — OMEPRAZOLE 40 MG PO CPDR: 40.0000 mg | DELAYED_RELEASE_CAPSULE | Freq: Every day | ORAL | Status: DC

## 2013-03-17 NOTE — Telephone Encounter (Signed)
Prior Auth from [employer], Raelyn Mora is not covered under patients insurance Patient needs to try Omeprazole first for thirty days Called patient to notify him of insurance decision  Patient said he never tried any RX PPI Advised patient I will send Omeprazole 40 mg to pharmacy, he needs to take one tablet by mouth once daily Patient requested it be sent to [facility] sent

## 2013-03-17 NOTE — Telephone Encounter (Signed)
Left message on answering machine. 

## 2013-03-21 ENCOUNTER — Encounter: Payer: Self-pay | Admitting: Gastroenterology

## 2013-04-02 ENCOUNTER — Encounter: Payer: Self-pay | Admitting: *Deleted

## 2013-04-03 ENCOUNTER — Ambulatory Visit: Payer: BC Managed Care – PPO | Admitting: Family Medicine

## 2013-04-03 ENCOUNTER — Ambulatory Visit (INDEPENDENT_AMBULATORY_CARE_PROVIDER_SITE_OTHER): Payer: BC Managed Care – PPO | Admitting: Internal Medicine

## 2013-04-03 ENCOUNTER — Ambulatory Visit: Payer: BC Managed Care – PPO | Admitting: Gastroenterology

## 2013-04-03 ENCOUNTER — Encounter: Payer: Self-pay | Admitting: Internal Medicine

## 2013-04-03 VITALS — BP 140/88 | HR 78 | Temp 98.6°F | Wt 256.0 lb

## 2013-04-03 DIAGNOSIS — I10 Essential (primary) hypertension: Secondary | ICD-10-CM

## 2013-04-03 NOTE — Progress Notes (Signed)
Subjective:    Patient ID: Andrew Gaines, male    DOB: [birth year], 47 y.o.   MRN: [id number]  HPI  47 year old patient who is seen today for followup of hypertension. He had been treated with beta blocker therapy which was discontinued due 2 sexual side effects. Presently he is on losartan 25 mg daily which he continues to tolerate well. He states his blood pressures are normal in the 120 range and diastolic readings are in the mid to high 80s. He describes rare palpitations but otherwise is tolerating this medication well  Past Medical History  Diagnosis Date  . GERD (gastroesophageal reflux disease)   . Unspecified essential hypertension   . Unspecified sinusitis (chronic)   . Hiatal hernia     History   Social History  . Marital Status: Single    Spouse Name: N/A    Number of Children: 1  . Years of Education: N/A   Occupational History  . Sales    Social History Main Topics  . Smoking status: Never Smoker   . Smokeless tobacco: Never Used  . Alcohol Use: 4.2 oz/week    7 Cans of beer per week     Comment: rarely  . Drug Use: No  . Sexual Activity: Not on file   Other Topics Concern  . Not on file   Social History Narrative  . No narrative on file    History reviewed. No pertinent past surgical history.  Family History  Problem Relation Age of Onset  . Breast cancer Maternal Grandmother   . Colon cancer Paternal Grandmother     No Known Allergies  Current Outpatient Prescriptions on File Prior to Visit  Medication Sig Dispense Refill  . losartan (COZAAR) 50 MG tablet Take 1 tablet (50 mg total) by mouth daily.  30 tablet  3  . omeprazole (PRILOSEC) 40 MG capsule Take 1 capsule (40 mg total) by mouth daily.  90 capsule  3   No current facility-administered medications on file prior to visit.    BP 140/88  Pulse 78  Temp(Src) 98.6 F (37 C) (Oral)  Wt 256 lb (116.121 kg)  BMI 34.71 kg/m2       Review of Systems  Constitutional: Negative for  fever, chills, appetite change and fatigue.  HENT: Negative for hearing loss, ear pain, congestion, sore throat, trouble swallowing, neck stiffness, dental problem, voice change and tinnitus.   Eyes: Negative for pain, discharge and visual disturbance.  Respiratory: Negative for cough, chest tightness, wheezing and stridor.   Cardiovascular: Positive for palpitations. Negative for chest pain and leg swelling.  Gastrointestinal: Negative for nausea, vomiting, abdominal pain, diarrhea, constipation, blood in stool and abdominal distention.  Genitourinary: Negative for urgency, hematuria, flank pain, discharge, difficulty urinating and genital sores.  Musculoskeletal: Negative for myalgias, back pain, joint swelling, arthralgias and gait problem.  Skin: Negative for rash.  Neurological: Negative for dizziness, syncope, speech difficulty, weakness, numbness and headaches.  Hematological: Negative for adenopathy. Does not bruise/bleed easily.  Psychiatric/Behavioral: Negative for behavioral problems and dysphoric mood. The patient is not nervous/anxious.        Objective:   Physical Exam  Constitutional: He is oriented to person, place, and time. He appears well-developed.  Blood pressure 130/80 on the right Blood pressure 135/88 on the left  HENT:  Head: Normocephalic.  Right Ear: External ear normal.  Left Ear: External ear normal.  Eyes: Conjunctivae and EOM are normal.  Neck: Normal range of motion.  Cardiovascular: Normal rate  and normal heart sounds.   Pulmonary/Chest: Breath sounds normal.  Abdominal: Bowel sounds are normal.  Musculoskeletal: Normal range of motion. He exhibits no edema and no tenderness.  Neurological: He is alert and oriented to person, place, and time.  Psychiatric: He has a normal mood and affect. His behavior is normal.          Assessment & Plan:   Hypertension. Reasonable control. We'll continue present regimen but the patient will up titrate  losartan 50 mg daily if blood pressure rises any higher Exogenous obesity. Weight loss encouraged Palpitations. The same and frequent and do not cause significant symptoms. Patient was asked to monitor his heart rate when he senses a rapid or irregular heart rate

## 2013-04-03 NOTE — Patient Instructions (Addendum)
Limit your sodium (Salt) intake    It is important that you exercise regularly, at least 20 minutes 3 to 4 times per week.  If you develop chest pain or shortness of breath seek  medical attention.  Please check your blood pressure on a regular basis.  If it is consistently greater than 150/90, please make an office appointment.  Return in 3 months for follow-up   You need to lose weight.  Consider a lower calorie diet and regular exercise.

## 2013-04-04 ENCOUNTER — Encounter: Payer: Self-pay | Admitting: Gastroenterology

## 2013-04-04 ENCOUNTER — Ambulatory Visit (INDEPENDENT_AMBULATORY_CARE_PROVIDER_SITE_OTHER): Payer: BC Managed Care – PPO | Admitting: Gastroenterology

## 2013-04-04 VITALS — BP 110/62 | HR 88 | Ht 71.0 in | Wt 254.1 lb

## 2013-04-04 DIAGNOSIS — E669 Obesity, unspecified: Secondary | ICD-10-CM

## 2013-04-04 DIAGNOSIS — K219 Gastro-esophageal reflux disease without esophagitis: Secondary | ICD-10-CM

## 2013-04-04 DIAGNOSIS — Z800 Family history of malignant neoplasm of digestive organs: Secondary | ICD-10-CM

## 2013-04-04 MED ORDER — OMEPRAZOLE 40 MG PO CPDR: 40.0000 mg | DELAYED_RELEASE_CAPSULE | Freq: Two times a day (BID) | ORAL | Status: DC

## 2013-04-04 NOTE — Progress Notes (Signed)
History of Present Illness: This is a 47 year old Caucasian male who has had acid reflux for over 20 years.  Recent endoscopy showed a prominent hiatal hernia and suspected Barrett's mucosa, but mucosal biopsies were negative for Barrett's.  He currently is on now Prilosec 40 mg a day and is having good success in treating his acid reflux symptoms.  He no longer complains of any dysphagia.  He discussed and acid reflux regime at length.    Current Medications, Allergies, Past Medical History, Past Surgical History, Family History and Social History were reviewed in [employer] record.  ROS: All systems were reviewed and are negative unless otherwise stated in the HPI.         Assessment and plan: We discussed today fundoplication surgery versus medical management of GERD.  He has opted for Prilosec 40 mg 30 minutes before supper and twice a day if needed.  I'll see him again in 6 months time.  He will need to be scheduled for colonoscopy with his family history of colon cancer and his age.  Indications for fundoplication surgery would be chronic bleeding, failure of medical management, recurrent dysphagia, or inability to take oral medications reliably.

## 2013-04-04 NOTE — Patient Instructions (Addendum)
  Please follow up with Dr. Jarold Motto in six months  Please take Prilosec 40 mg twice daily. Thirty minutes before breakfast and thirty minutes before dinner.  ______________________________________________________________________________________________________                                               We are excited to introduce MyChart, a new best-in-class service that provides you online access to important information in your electronic medical record. We want to make it easier for you to view your health information - all in one secure location - when and where you need it. We expect MyChart will enhance the quality of care and service we provide.  When you register for MyChart, you can:    View your test results.    Request appointments and receive appointment reminders via email.    Request medication renewals.    View your medical history, allergies, medications and immunizations.    Communicate with your physician's office through a password-protected site.    Conveniently print information such as your medication lists.  To find out if MyChart is right for you, please talk to a member of our clinical staff today. We will gladly answer your questions about this free health and wellness tool.  If you are age 49 or older and want a member of your family to have access to your record, you must provide written consent by completing a proxy form available at our office. Please speak to our clinical staff about guidelines regarding accounts for patients younger than age 49.  As you activate your MyChart account and need any technical assistance, please call the MyChart technical support line at [phone]) or email your question to [email or web address]. If you email your question(s), please include your name, a return phone number and the best time to reach you.  If you have non-urgent health-related questions, you can send a message to our office through  MyChart at [email or web address]. If you have a medical emergency, call 911.  Thank you for using MyChart as your new health and wellness resource!   MyChart licensed from [employer],  2130-8657. Patents Pending.

## 2013-08-04 ENCOUNTER — Ambulatory Visit: Payer: BC Managed Care – PPO | Admitting: Family Medicine

## 2013-08-05 ENCOUNTER — Encounter: Payer: Self-pay | Admitting: Family Medicine

## 2013-08-05 ENCOUNTER — Ambulatory Visit (INDEPENDENT_AMBULATORY_CARE_PROVIDER_SITE_OTHER): Payer: BC Managed Care – PPO | Admitting: Family Medicine

## 2013-08-05 VITALS — BP 130/80 | Temp 99.0°F | Wt 255.0 lb

## 2013-08-05 DIAGNOSIS — I10 Essential (primary) hypertension: Secondary | ICD-10-CM

## 2013-08-05 NOTE — Progress Notes (Signed)
Pre visit review using our clinic review tool, if applicable. No additional management support is needed unless otherwise documented below in the visit note. 

## 2013-08-05 NOTE — Progress Notes (Signed)
   Subjective:    Patient ID: Andrew Gaines, male    DOB: February 10, 1966, 47 y.o.   MRN: [id number]  HPI Andrew Gaines is a 47 year old married male nonsmoker who comes in today for evaluation of hypertension  He's taking losartan 25 mg daily BP 130/80 no side effects   Review of Systems    review of systems otherwise negative Objective:   Physical Exam Well-developed well-nourished male no acute distress vital signs stable he is afebrile BP right arm sitting position 130/80       Assessment & Plan:  Hypertension adult continue current therapy followup in June for annual physical

## 2013-08-05 NOTE — Patient Instructions (Signed)
Continue your current medication dose  Check your blood pressure at home once weekly  Blood pressure goal 135/85 or less  Followup in June for your annual physical exam

## 2013-10-27 ENCOUNTER — Other Ambulatory Visit: Payer: Self-pay | Admitting: Family Medicine

## 2013-10-30 ENCOUNTER — Ambulatory Visit (INDEPENDENT_AMBULATORY_CARE_PROVIDER_SITE_OTHER): Payer: BC Managed Care – PPO | Admitting: Neurology

## 2013-10-30 ENCOUNTER — Encounter: Payer: Self-pay | Admitting: Neurology

## 2013-10-30 ENCOUNTER — Encounter (INDEPENDENT_AMBULATORY_CARE_PROVIDER_SITE_OTHER): Payer: Self-pay

## 2013-10-30 VITALS — BP 149/78 | HR 91 | Ht 72.0 in | Wt 263.0 lb

## 2013-10-30 DIAGNOSIS — H469 Unspecified optic neuritis: Secondary | ICD-10-CM

## 2013-10-30 NOTE — Patient Instructions (Signed)
Overall you are doing fairly well but I do want to suggest a few things today:   As far as diagnostic testing:  1)I would like you to have a MRI of your brain. You will be called to schedule this  Follow up once MRI completed. Please call us with any interim questions, concerns, problems, updates or refill requests.   My clinical assistant and will answer any of your questions and relay your messages to me and also relay most of my messages to you.   Our phone number is [phone]. We also have an after hours call service for urgent matters and there is a physician on-call for urgent questions. For any emergencies you know to call 911 or go to the nearest emergency room

## 2013-10-30 NOTE — Progress Notes (Signed)
[facility]    Provider:  Dr Janann Colonel Referring Provider: Dorena Cookey, MD Primary Care Physician:  Joycelyn Man, MD  CC:  Optic neuritis  HPI:  Andrew Gaines is a 48 y.o. male here as a referral from Dr. Sherren Mocha for evaluation of optic neuritis  Notes decrease in visual acuity over the past 1 month. Had eye exam completed showing possible optic neuritis in OS. Feels his symptoms came on suddenly, notes his left eye felt funny, covered his left eye and found that it was blurry in the central part of his vision. Denies any pain, notes eye felt "heavy". No prior history of this in the past. No prior history of focal motor or sensory changes. No gait instability, no dizziness, no light headed sensation, no vertigo. Has borderline hight BP, no DM, no HLD. No history of A fib or palpitations.   No family history of neurological disorders.   Review of Systems: Out of a complete 14 system review, the patient complains of only the following symptoms, and all other reviewed systems are negative. + blurred vision, loss of vision  History   Social History  . Marital Status: Married    Spouse Name: [name]    Number of Children: 1  . Years of Education: Bachelor   Occupational History  . Sales    Social History Main Topics  . Smoking status: Never Smoker   . Smokeless tobacco: Never Used  . Alcohol Use: 4.2 oz/week    7 Cans of beer per week     Comment: rarely  . Drug Use: No  . Sexual Activity: Not on file   Other Topics Concern  . Not on file   Social History Narrative   Patient is married to [place], has 1 child   Patient is right handed   Education level is Bachelor's degree   Caffeine consumption is 2-3 cups daily          Family History  Problem Relation Age of Onset  . Breast cancer Maternal Grandmother   . Colon cancer Paternal Grandmother     Past Medical History  Diagnosis Date  . GERD (gastroesophageal reflux disease)   .  Unspecified essential hypertension   . Unspecified sinusitis (chronic)   . Hiatal hernia     No past surgical history on file.  Current Outpatient Prescriptions  Medication Sig Dispense Refill  . losartan (COZAAR) 50 MG tablet Take 25 mg by mouth daily.      Marland Kitchen omeprazole (PRILOSEC) 40 MG capsule Take 1 capsule (40 mg total) by mouth 2 (two) times daily.  60 capsule  11   No current facility-administered medications for this visit.    Allergies as of 10/30/2013  . (No Known Allergies)    Vitals: BP 149/78  Pulse 91  Ht 6' (1.829 m)  Wt 263 lb (119.296 kg)  BMI 35.66 kg/m2 Last Weight:  Wt Readings from Last 1 Encounters:  10/30/13 263 lb (119.296 kg)   Last Height:   Ht Readings from Last 1 Encounters:  10/30/13 6' (1.829 m)     Physical exam: Exam: Gen: NAD, conversant Eyes: anicteric sclerae, moist conjunctivae HENT: Atraumatic, oropharynx clear Neck: Trachea midline; supple,  Lungs: CTA, no wheezing, rales, rhonic                          CV: RRR, no MRG Abdomen: Soft, non-tender;  Extremities: No peripheral edema  Skin: Normal temperature,  no rash,  Psych: Appropriate affect, pleasant  Neuro: MS: AA&Ox3, appropriately interactive, normal affect   Speech: fluent w/o paraphasic error  Memory: good recent and remote recall  CN: PERRL, EOMI no nystagmus, minimally decreased VA in central vision of OS, unable to fully visualize optic disc due to pupil size,  no ptosis, sensation intact to LT V1-V3 bilat, face symmetric, no weakness, hearing grossly intact, palate elevates symmetrically, shoulder shrug 5/5 bilat,  tongue protrudes midline, no fasiculations noted.  Motor: normal bulk and tone Strength: 5/5  In all extremities  Coord: rapid alternating and point-to-point (FNF, HTS) movements intact.  Reflexes: symmetrical, bilat downgoing toes  Sens: LT intact in all extremities  Gait: posture, stance, stride and arm-swing normal. Tandem gait intact.  Able to walk on heels and toes. Romberg absent.   Assessment:  After physical and neurologic examination, review of laboratory studies, imaging, neurophysiology testing and pre-existing records, assessment will be reviewed on the problem list.  Plan:  Treatment plan and additional workup will be reviewed under Problem List.  1)Optic neuritis  48y/o gentleman presenting for initial evaluation of blurry vision in OS with recent eye exam raising concern of optic disc swelling. Physical exam is overall unremarkable with some decreased VA in central vision of OS (unable to fully visualize discs due to pupil size). Differential of optic neuritis would be a demyelinating process vs an ischemic process. Will check MRI brain with and without contrast. Would hold off on steroids at this time due to symptom onset >3 weeks ago and unclear etiology.    Jim Like, DO  [facility] [address] [place], [place] [address]  Phone [phone] Fax [phone]

## 2013-11-06 ENCOUNTER — Ambulatory Visit (INDEPENDENT_AMBULATORY_CARE_PROVIDER_SITE_OTHER): Payer: BC Managed Care – PPO

## 2013-11-06 DIAGNOSIS — H469 Unspecified optic neuritis: Secondary | ICD-10-CM

## 2013-11-06 MED ORDER — GADOPENTETATE DIMEGLUMINE 469.01 MG/ML IV SOLN: 20.0000 mL | Freq: Once | INTRAVENOUS | Status: AC | PRN

## 2013-11-10 NOTE — Progress Notes (Signed)
Quick Note:  Spoke with patient about his results, patient is asking about any solutions for his blurry vision. Please advise ______

## 2013-11-14 ENCOUNTER — Telehealth: Payer: Self-pay

## 2013-11-14 NOTE — Telephone Encounter (Signed)
Dr Ellie Lunch called would for you to contact her [phone]  About mutual  Patient  Andrew Gaines dob; 10-May-2066

## 2013-11-17 ENCOUNTER — Other Ambulatory Visit: Payer: Self-pay | Admitting: Neurology

## 2013-11-17 ENCOUNTER — Telehealth: Payer: Self-pay | Admitting: Neurology

## 2013-11-17 DIAGNOSIS — I639 Cerebral infarction, unspecified: Secondary | ICD-10-CM

## 2013-11-17 NOTE — Progress Notes (Signed)
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 Will order carotid ultrasound, lab work, instruct patient to take daily aspirin.

## 2013-11-17 NOTE — Telephone Encounter (Signed)
Called patient to inform him that Dr. Janann Colonel wants to have him come by the office and get some more blood work done, he also wants him to start taking OTC Asprin 81 mg, and he is being scheduled for a carotid ultrasound.

## 2013-11-26 ENCOUNTER — Ambulatory Visit (INDEPENDENT_AMBULATORY_CARE_PROVIDER_SITE_OTHER): Payer: BC Managed Care – PPO

## 2013-11-26 ENCOUNTER — Other Ambulatory Visit (INDEPENDENT_AMBULATORY_CARE_PROVIDER_SITE_OTHER): Payer: Self-pay

## 2013-11-26 DIAGNOSIS — I639 Cerebral infarction, unspecified: Secondary | ICD-10-CM

## 2013-11-26 DIAGNOSIS — I6350 Cerebral infarction due to unspecified occlusion or stenosis of unspecified cerebral artery: Secondary | ICD-10-CM

## 2013-11-26 DIAGNOSIS — Z0289 Encounter for other administrative examinations: Secondary | ICD-10-CM

## 2013-11-27 ENCOUNTER — Other Ambulatory Visit (INDEPENDENT_AMBULATORY_CARE_PROVIDER_SITE_OTHER): Payer: Self-pay

## 2013-11-27 DIAGNOSIS — Z0289 Encounter for other administrative examinations: Secondary | ICD-10-CM

## 2013-11-28 ENCOUNTER — Other Ambulatory Visit: Payer: Self-pay | Admitting: Neurology

## 2013-11-28 LAB — HEMOGLOBIN A1C
Est. average glucose Bld gHb Est-mCnc: 151 mg/dL
HEMOGLOBIN A1C: 6.9 % — AB (ref 4.8–5.6)

## 2013-11-28 LAB — LIPID PANEL
CHOL/HDL RATIO: 3.7 ratio (ref 0.0–5.0)
Cholesterol, Total: 202 mg/dL — ABNORMAL HIGH (ref 100–199)
HDL: 55 mg/dL (ref >39)
LDL CALC: 122 mg/dL — AB (ref 0–99)
Triglycerides: 124 mg/dL (ref 0–149)
VLDL Cholesterol Cal: 25 mg/dL (ref 5–40)

## 2013-11-28 MED ORDER — ATORVASTATIN CALCIUM 20 MG PO TABS: 20.0000 mg | ORAL_TABLET | Freq: Every day | ORAL | Status: DC

## 2013-11-28 NOTE — Progress Notes (Signed)
Quick Note:  Spoke with patient informed him of cholesterol and blood sugar levels, patient expressed understanding, and will follow up with his PCP. ______

## 2014-02-10 ENCOUNTER — Other Ambulatory Visit (INDEPENDENT_AMBULATORY_CARE_PROVIDER_SITE_OTHER): Payer: BC Managed Care – PPO

## 2014-02-10 DIAGNOSIS — I10 Essential (primary) hypertension: Secondary | ICD-10-CM

## 2014-02-10 LAB — HEPATIC FUNCTION PANEL
ALBUMIN: 3.9 g/dL (ref 3.5–5.2)
ALT: 39 U/L (ref 0–53)
AST: 27 U/L (ref 0–37)
Alkaline Phosphatase: 63 U/L (ref 39–117)
Bilirubin, Direct: 0.2 mg/dL (ref 0.0–0.3)
Total Bilirubin: 0.9 mg/dL (ref 0.2–1.2)
Total Protein: 6.6 g/dL (ref 6.0–8.3)

## 2014-02-10 LAB — CBC WITH DIFFERENTIAL/PLATELET
Basophils Absolute: 0 10*3/uL (ref 0.0–0.1)
Basophils Relative: 0.9 % (ref 0.0–3.0)
EOS PCT: 10.3 % — AB (ref 0.0–5.0)
Eosinophils Absolute: 0.5 10*3/uL (ref 0.0–0.7)
HEMATOCRIT: 41.3 % (ref 39.0–52.0)
Hemoglobin: 13.4 g/dL (ref 13.0–17.0)
LYMPHS ABS: 1.5 10*3/uL (ref 0.7–4.0)
Lymphocytes Relative: 29.2 % (ref 12.0–46.0)
MCHC: 32.6 g/dL (ref 30.0–36.0)
MCV: 84.7 fl (ref 78.0–100.0)
MONO ABS: 0.5 10*3/uL (ref 0.1–1.0)
MONOS PCT: 9.6 % (ref 3.0–12.0)
NEUTROS ABS: 2.6 10*3/uL (ref 1.4–7.7)
Neutrophils Relative %: 50 % (ref 43.0–77.0)
Platelets: 207 10*3/uL (ref 150.0–400.0)
RBC: 4.87 Mil/uL (ref 4.22–5.81)
RDW: 16.1 % — ABNORMAL HIGH (ref 11.5–15.5)
WBC: 5.2 10*3/uL (ref 4.0–10.5)

## 2014-02-10 LAB — POCT URINALYSIS DIPSTICK
BILIRUBIN UA: NEGATIVE
Blood, UA: NEGATIVE
Glucose, UA: NEGATIVE
KETONES UA: NEGATIVE
Leukocytes, UA: NEGATIVE
Nitrite, UA: NEGATIVE
PH UA: 6.5
PROTEIN UA: NEGATIVE
SPEC GRAV UA: 1.025
Urobilinogen, UA: 0.2

## 2014-02-10 LAB — BASIC METABOLIC PANEL
BUN: 17 mg/dL (ref 6–23)
CHLORIDE: 104 meq/L (ref 96–112)
CO2: 26 mEq/L (ref 19–32)
Calcium: 9 mg/dL (ref 8.4–10.5)
Creatinine, Ser: 0.9 mg/dL (ref 0.4–1.5)
GFR: 90.43 mL/min (ref >60.00)
Glucose, Bld: 141 mg/dL — ABNORMAL HIGH (ref 70–99)
POTASSIUM: 4.3 meq/L (ref 3.5–5.1)
Sodium: 139 mEq/L (ref 135–145)

## 2014-02-10 LAB — LIPID PANEL
CHOLESTEROL: 210 mg/dL — AB (ref 0–200)
HDL: 48.5 mg/dL (ref >39.00)
LDL Cholesterol: 120 mg/dL — ABNORMAL HIGH (ref 0–99)
NonHDL: 161.5
Total CHOL/HDL Ratio: 4
Triglycerides: 206 mg/dL — ABNORMAL HIGH (ref 0.0–149.0)
VLDL: 41.2 mg/dL — ABNORMAL HIGH (ref 0.0–40.0)

## 2014-02-10 LAB — PSA: PSA: 5.56 ng/mL — ABNORMAL HIGH (ref 0.10–4.00)

## 2014-02-10 LAB — TSH: TSH: 1.64 u[IU]/mL (ref 0.35–4.50)

## 2014-02-13 ENCOUNTER — Telehealth: Payer: Self-pay | Admitting: Family Medicine

## 2014-02-13 NOTE — Telephone Encounter (Addendum)
Pt would like blood work results. Pt rsc  cpx is 03-02-14

## 2014-02-17 ENCOUNTER — Encounter: Payer: BC Managed Care – PPO | Admitting: Family Medicine

## 2014-02-17 NOTE — Telephone Encounter (Signed)
Dr. Sherren Mocha told me to inform the pt that they would go over his lab results when he came in for his cpe.  Pt has been notified

## 2014-03-02 ENCOUNTER — Ambulatory Visit (INDEPENDENT_AMBULATORY_CARE_PROVIDER_SITE_OTHER): Payer: BC Managed Care – PPO | Admitting: Family Medicine

## 2014-03-02 ENCOUNTER — Encounter: Payer: Self-pay | Admitting: Family Medicine

## 2014-03-02 VITALS — BP 140/98 | Temp 98.7°F | Ht 71.75 in | Wt 256.0 lb

## 2014-03-02 DIAGNOSIS — R7309 Other abnormal glucose: Secondary | ICD-10-CM

## 2014-03-02 DIAGNOSIS — R739 Hyperglycemia, unspecified: Secondary | ICD-10-CM

## 2014-03-02 DIAGNOSIS — K2100 Gastro-esophageal reflux disease with esophagitis, without bleeding: Secondary | ICD-10-CM

## 2014-03-02 DIAGNOSIS — F1510 Other stimulant abuse, uncomplicated: Secondary | ICD-10-CM

## 2014-03-02 DIAGNOSIS — J309 Allergic rhinitis, unspecified: Secondary | ICD-10-CM

## 2014-03-02 DIAGNOSIS — Z23 Encounter for immunization: Secondary | ICD-10-CM

## 2014-03-02 DIAGNOSIS — E785 Hyperlipidemia, unspecified: Secondary | ICD-10-CM

## 2014-03-02 DIAGNOSIS — I10 Essential (primary) hypertension: Secondary | ICD-10-CM

## 2014-03-02 DIAGNOSIS — H469 Unspecified optic neuritis: Secondary | ICD-10-CM

## 2014-03-02 MED ORDER — ATORVASTATIN CALCIUM 20 MG PO TABS: 20.0000 mg | ORAL_TABLET | Freq: Every day | ORAL | Status: DC

## 2014-03-02 MED ORDER — LOSARTAN POTASSIUM 50 MG PO TABS: ORAL_TABLET | ORAL | Status: DC

## 2014-03-02 MED ORDER — OMEPRAZOLE 40 MG PO CPDR: DELAYED_RELEASE_CAPSULE | ORAL | Status: DC

## 2014-03-02 NOTE — Progress Notes (Signed)
Subjective:    Patient ID: Andrew Gaines, male    DOB: 11-23-1965, 48 y.o.   MRN: [id number]  HPI Witt is a 48 year old male who comes in today for general physical examination  He has a history of hyperlipidemia he takes Lipitor 20 mg daily  He increase his dose of Cozaar to 50 mg a day from 25 because his blood pressure was elevated. It went up when his psychiatrist started him on Adderall. He went for an evaluation of possible ADD. They then switched him to Strattera which she's been on for 6 days blood pressure 140/98  He takes omeprazole 40 mg a day bedtime  He gets routine eye care....... saw an ophthalmologist is here because of decreased visual acuity left eye. Turns out he said some optic nerve damage etiology on  He gets routine dental care, colonoscopy at age 52  Needs a tetanus booster  The evaluation decreased visual acuity also included MRI which was normal  His ADD does not interfere with his work however his wife complains about his motivation for doing things that he doesn't like to do.   Review of Systems  Constitutional: Negative.   HENT: Negative.   Eyes: Negative.   Respiratory: Negative.   Cardiovascular: Negative.   Gastrointestinal: Negative.   Genitourinary: Negative.   Musculoskeletal: Negative.   Skin: Negative.   Neurological: Negative.   Psychiatric/Behavioral: Negative.        Objective:   Physical Exam  Nursing note and vitals reviewed. Constitutional: He is oriented to person, place, and time. He appears well-developed and well-nourished.  HENT:  Head: Normocephalic and atraumatic.  Right Ear: External ear normal.  Left Ear: External ear normal.  Nose: Nose normal.  Mouth/Throat: Oropharynx is clear and moist.  Eyes: Conjunctivae and EOM are normal. Pupils are equal, round, and reactive to light.  Neck: Normal range of motion. Neck supple. No JVD present. No tracheal deviation present. No thyromegaly present.  Cardiovascular:  Normal rate, regular rhythm, normal heart sounds and intact distal pulses.  Exam reveals no gallop and no friction rub.   No murmur heard. Pulmonary/Chest: Effort normal and breath sounds normal. No stridor. No respiratory distress. He has no wheezes. He has no rales. He exhibits no tenderness.  Abdominal: Soft. Bowel sounds are normal. He exhibits no distension and no mass. There is no tenderness. There is no rebound and no guarding.  Genitourinary: Rectum normal, prostate normal and penis normal. Guaiac negative stool. No penile tenderness.  Musculoskeletal: Normal range of motion. He exhibits no edema and no tenderness.  Lymphadenopathy:    He has no cervical adenopathy.  Neurological: He is alert and oriented to person, place, and time. He has normal reflexes. No cranial nerve deficit. He exhibits normal muscle tone.  Skin: Skin is warm and dry. No rash noted. No erythema. No pallor.  Total body skin exam normal except for scar right knee from previous knee surgery  Psychiatric: He has a normal mood and affect. His behavior is normal. Judgment and thought content normal.   Question nodule right lobe of the prostate       Assessment & Plan:  Healthy male  Hyperlipidemia LDL 120 advised to take his Lipitor bedtime  Hypertension.....Marland Kitchen continue Cozaar 50 mg I would recommend he stop the Strattera. 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 Dutch Gray....... at the [facility]rology Center  Elevated blood sugar.......... avoid carbohydrates walk 30 minutes daily followup blood sugar and A1c in 3 months

## 2014-03-02 NOTE — Progress Notes (Signed)
Pre visit review using our clinic review tool, if applicable. No additional management support is needed unless otherwise documented below in the visit note. 

## 2014-03-02 NOTE — Patient Instructions (Addendum)
Take the Lipitor at bedtime along with the Prilosec  Losartan 50 mg in the morning  Check a blood pressure daily in the morning  Followup in 4 weeks............. when you return and bring a record of all your blood pressure readings and the device  Since your blood pressure is elevated I recommend he stop the Strattera  Avoid carbohydrates....... Sugars......... walk 30 minutes daily  Return in 3 months for followup  Labs one week prior.........Marland Kitchen 11:30 in the morning........Marland Kitchen or 4:30 in the afternoon  Call Dr. Milus Mallick at the urology Center because of your elevated PSA

## 2014-03-03 ENCOUNTER — Telehealth: Payer: Self-pay | Admitting: Family Medicine

## 2014-03-03 NOTE — Telephone Encounter (Signed)
Relevant patient education mailed to patient.  

## 2014-04-02 ENCOUNTER — Encounter: Payer: Self-pay | Admitting: Family Medicine

## 2014-04-02 ENCOUNTER — Ambulatory Visit (INDEPENDENT_AMBULATORY_CARE_PROVIDER_SITE_OTHER): Payer: BC Managed Care – PPO | Admitting: Family Medicine

## 2014-04-02 VITALS — BP 130/90 | Temp 98.5°F | Wt 260.0 lb

## 2014-04-02 DIAGNOSIS — I10 Essential (primary) hypertension: Secondary | ICD-10-CM

## 2014-04-02 NOTE — Progress Notes (Signed)
Pre visit review using our clinic review tool, if applicable. No additional management support is needed unless otherwise documented below in the visit note. 

## 2014-04-02 NOTE — Patient Instructions (Signed)
Check your blood pressure weekly  Return July 2016 for your annual exam  Sooner if any problems

## 2014-04-02 NOTE — Progress Notes (Signed)
   Subjective:    Patient ID: Andrew Gaines, male    DOB: 1966-06-14, 48 y.o.   MRN: [id number]  HPI [name] is a 48 year old married male nonsmoker who comes in today for followup of new-onset hypertension  We started him on Cozaar 50 mg daily BP now is 130/85. On his machine is 123/85. No side effects to medication   Review of Systems Review of systems otherwise negative    Objective:   Physical Exam  Well-developed well-nourished male in no acute distress vital signs stable he is afebrile BP right arm sitting position 120/80      Assessment & Plan:  Hypertension at goal,,,,,,,,,,,, continue current therapy followup July 2016,

## 2014-04-07 ENCOUNTER — Encounter: Payer: BC Managed Care – PPO | Admitting: Family Medicine

## 2014-05-11 ENCOUNTER — Other Ambulatory Visit: Payer: Self-pay | Admitting: Gastroenterology

## 2014-05-27 ENCOUNTER — Other Ambulatory Visit: Payer: Self-pay | Admitting: Urology

## 2014-06-05 ENCOUNTER — Other Ambulatory Visit: Payer: BC Managed Care – PPO

## 2014-06-09 ENCOUNTER — Ambulatory Visit: Payer: BC Managed Care – PPO | Admitting: Family Medicine

## 2014-06-12 ENCOUNTER — Other Ambulatory Visit: Payer: Self-pay | Admitting: Gastroenterology

## 2014-06-17 ENCOUNTER — Other Ambulatory Visit: Payer: Self-pay | Admitting: Gastroenterology

## 2014-06-19 ENCOUNTER — Other Ambulatory Visit: Payer: Self-pay | Admitting: *Deleted

## 2014-06-19 DIAGNOSIS — K2100 Gastro-esophageal reflux disease with esophagitis, without bleeding: Secondary | ICD-10-CM

## 2014-06-19 MED ORDER — OMEPRAZOLE 40 MG PO CPDR: DELAYED_RELEASE_CAPSULE | ORAL | Status: DC

## 2014-06-21 DIAGNOSIS — C801 Malignant (primary) neoplasm, unspecified: Secondary | ICD-10-CM

## 2014-06-21 HISTORY — DX: Malignant (primary) neoplasm, unspecified: C80.1

## 2014-07-07 NOTE — Patient Instructions (Addendum)
Andrew Gaines  07/07/2014   Your procedure is scheduled on: 07/20/2014     Come thru the [facility] Entrance   Follow the Signs to [place] at  [place]      am  Call this number if you have problems the morning of surgery: [phone]   Remember:   Do not eat food or drink liquids after midnight.   Take these medicines the morning of surgery with A SIP OF WATER: Lexapro    Do not wear jewelry,  Do not wear lotions, powders, or perfumes. deodorant.  . Men may shave face and neck.  Do not bring valuables to the hospital.  Contacts, dentures or bridgework may not be worn into surgery.  Leave suitcase in the car. After surgery it may be brought to your room.  For patients admitted to the hospital, checkout time is 11:00 AM the day of  discharge.  .     Please read over the following fact sheets that you were given: MRSA Information, coughing and deep breathing exercises, leg exercises            [facility] - Preparing for Surgery Before surgery, you can play an important role.  Because skin is not sterile, your skin needs to be as free of germs as possible.  You can reduce the number of germs on your skin by washing with CHG (chlorahexidine gluconate) soap before surgery.  CHG is an antiseptic cleaner which kills germs and bonds with the skin to continue killing germs even after washing. Please DO NOT use if you have an allergy to CHG or antibacterial soaps.  If your skin becomes reddened/irritated stop using the CHG and inform your nurse when you arrive at Short Stay. Do not shave (including legs and underarms) for at least 48 hours prior to the first CHG shower.  You may shave your face/neck. Please follow these instructions carefully:  1.  Shower with CHG Soap the night before surgery and the  morning of Surgery.  2.  If you choose to wash your hair, wash your hair first as usual with your  normal  shampoo.  3.  After you shampoo, rinse your hair and body thoroughly to remove  the  shampoo.                           4.  Use CHG as you would any other liquid soap.  You can apply chg directly  to the skin and wash                       Gently with a scrungie or clean washcloth.  5.  Apply the CHG Soap to your body ONLY FROM THE NECK DOWN.   Do not use on face/ open                           Wound or open sores. Avoid contact with eyes, ears mouth and genitals (private parts).                       Wash face,  Genitals (private parts) with your normal soap.             6.  Wash thoroughly, paying special attention to the area where your surgery  will be performed.  7.  Thoroughly rinse your body with warm water from  the neck down.  8.  DO NOT shower/wash with your normal soap after using and rinsing off  the CHG Soap.                9.  Pat yourself dry with a clean towel.            10.  Wear clean pajamas.            11.  Place clean sheets on your bed the night of your first shower and do not  sleep with pets. Day of Surgery : Do not apply any lotions/deodorants the morning of surgery.  Please wear clean clothes to the hospital/surgery center.  FAILURE TO FOLLOW THESE INSTRUCTIONS MAY RESULT IN THE CANCELLATION OF YOUR SURGERY PATIENT SIGNATURE_________________________________  NURSE SIGNATURE__________________________________  ________________________________________________________________________  WHAT IS A BLOOD TRANSFUSION? Blood Transfusion Information  A transfusion is the replacement of blood or some of its parts. Blood is made up of multiple cells which provide different functions.  Red blood cells carry oxygen and are used for blood loss replacement.  White blood cells fight against infection.  Platelets control bleeding.  Plasma helps clot blood.  Other blood products are available for specialized needs, such as hemophilia or other clotting disorders. BEFORE THE TRANSFUSION  Who gives blood for transfusions?   Healthy volunteers who are  fully evaluated to make sure their blood is safe. This is blood bank blood. Transfusion therapy is the safest it has ever been in the practice of medicine. Before blood is taken from a donor, a complete history is taken to make sure that person has no history of diseases nor engages in risky social behavior (examples are intravenous drug use or sexual activity with multiple partners). The donor's travel history is screened to minimize risk of transmitting infections, such as malaria. The donated blood is tested for signs of infectious diseases, such as HIV and hepatitis. The blood is then tested to be sure it is compatible with you in order to minimize the chance of a transfusion reaction. If you or a relative donates blood, this is often done in anticipation of surgery and is not appropriate for emergency situations. It takes many days to process the donated blood. RISKS AND COMPLICATIONS Although transfusion therapy is very safe and saves many lives, the main dangers of transfusion include:  1. Getting an infectious disease. 2. Developing a transfusion reaction. This is an allergic reaction to something in the blood you were given. Every precaution is taken to prevent this. The decision to have a blood transfusion has been considered carefully by your caregiver before blood is given. Blood is not given unless the benefits outweigh the risks. AFTER THE TRANSFUSION  Right after receiving a blood transfusion, you will usually feel much better and more energetic. This is especially true if your red blood cells have gotten low (anemic). The transfusion raises the level of the red blood cells which carry oxygen, and this usually causes an energy increase.  The nurse administering the transfusion will monitor you carefully for complications. HOME CARE INSTRUCTIONS  No special instructions are needed after a transfusion. You may find your energy is better. Speak with your caregiver about any limitations on  activity for underlying diseases you may have. SEEK MEDICAL CARE IF:   Your condition is not improving after your transfusion.  You develop redness or irritation at the intravenous (IV) site. SEEK IMMEDIATE MEDICAL CARE IF:  Any of the following symptoms occur over the next 12  hours:  Shaking chills.  You have a temperature by mouth above 102 F (38.9 C), not controlled by medicine.  Chest, back, or muscle pain.  People around you feel you are not acting correctly or are confused.  Shortness of breath or difficulty breathing.  Dizziness and fainting.  You get a rash or develop hives.  You have a decrease in urine output.  Your urine turns a dark color or changes to pink, red, or [place]. Any of the following symptoms occur over the next 10 days:  You have a temperature by mouth above 102 F (38.9 C), not controlled by medicine.  Shortness of breath.  Weakness after normal activity.  The white part of the eye turns yellow (jaundice).  You have a decrease in the amount of urine or are urinating less often.  Your urine turns a dark color or changes to pink, red, or [place]. Document Released: 08/04/2000 Document Revised: 10/30/2011 Document Reviewed: 03/23/2008 ExitCare Patient Information 2014 [place].  _______________________________________________________________________  Incentive Spirometer  An incentive spirometer is a tool that can help keep your lungs clear and active. This tool measures how well you are filling your lungs with each breath. Taking long deep breaths may help reverse or decrease the chance of developing breathing (pulmonary) problems (especially infection) following:  A long period of time when you are unable to move or be active. BEFORE THE PROCEDURE   If the spirometer includes an indicator to show your best effort, your nurse or respiratory therapist will set it to a desired goal.  If possible, sit up straight or lean slightly  forward. Try not to slouch.  Hold the incentive spirometer in an upright position. INSTRUCTIONS FOR USE  3. Sit on the edge of your bed if possible, or sit up as far as you can in bed or on a chair. 4. Hold the incentive spirometer in an upright position. 5. Breathe out normally. 6. Place the mouthpiece in your mouth and seal your lips tightly around it. 7. Breathe in slowly and as deeply as possible, raising the piston or the ball toward the top of the column. 8. Hold your breath for 3-5 seconds or for as long as possible. Allow the piston or ball to fall to the bottom of the column. 9. Remove the mouthpiece from your mouth and breathe out normally. 10. Rest for a few seconds and repeat Steps 1 through 7 at least 10 times every 1-2 hours when you are awake. Take your time and take a few normal breaths between deep breaths. 11. The spirometer may include an indicator to show your best effort. Use the indicator as a goal to work toward during each repetition. 12. After each set of 10 deep breaths, practice coughing to be sure your lungs are clear. If you have an incision (the cut made at the time of surgery), support your incision when coughing by placing a pillow or rolled up towels firmly against it. Once you are able to get out of bed, walk around indoors and cough well. You may stop using the incentive spirometer when instructed by your caregiver.  RISKS AND COMPLICATIONS  Take your time so you do not get dizzy or light-headed.  If you are in pain, you may need to take or ask for pain medication before doing incentive spirometry. It is harder to take a deep breath if you are having pain. AFTER USE  Rest and breathe slowly and easily.  It can be helpful to keep  track of a log of your progress. Your caregiver can provide you with a simple table to help with this. If you are using the spirometer at home, follow these instructions: [place] IF:   You are having difficultly using  the spirometer.  You have trouble using the spirometer as often as instructed.  Your pain medication is not giving enough relief while using the spirometer.  You develop fever of 100.5 F (38.1 C) or higher. SEEK IMMEDIATE MEDICAL CARE IF:   You cough up bloody sputum that had not been present before.  You develop fever of 102 F (38.9 C) or greater.  You develop worsening pain at or near the incision site. MAKE SURE YOU:   Understand these instructions.  Will watch your condition.  Will get help right away if you are not doing well or get worse. Document Released: 12/18/2006 Document Revised: 10/30/2011 Document Reviewed: 02/18/2007 [facility] Patient Information 2014 [place], [place].   ________________________________________________________________________

## 2014-07-09 ENCOUNTER — Encounter (HOSPITAL_COMMUNITY)
Admission: RE | Admit: 2014-07-09 | Discharge: 2014-07-09 | Disposition: A | Discharge status: Home / Self Care | Payer: BC Managed Care – PPO | Source: Ambulatory Visit | Attending: Urology | Admitting: Urology

## 2014-07-09 ENCOUNTER — Encounter: Payer: Self-pay | Admitting: Internal Medicine

## 2014-07-09 ENCOUNTER — Encounter (HOSPITAL_COMMUNITY): Payer: Self-pay

## 2014-07-09 ENCOUNTER — Ambulatory Visit (HOSPITAL_COMMUNITY)
Admission: RE | Admit: 2014-07-09 | Discharge: 2014-07-09 | Disposition: A | Discharge status: Home / Self Care | Payer: BC Managed Care – PPO | Source: Ambulatory Visit | Attending: Urology | Admitting: Urology

## 2014-07-09 DIAGNOSIS — Z01818 Encounter for other preprocedural examination: Secondary | ICD-10-CM | POA: Diagnosis present

## 2014-07-09 HISTORY — DX: Other specified postprocedural states: Z98.890

## 2014-07-09 HISTORY — DX: Other complications of anesthesia, initial encounter: T88.59XA

## 2014-07-09 HISTORY — DX: Malignant (primary) neoplasm, unspecified: C80.1

## 2014-07-09 HISTORY — DX: Other specified postprocedural states: R11.2

## 2014-07-09 HISTORY — DX: Adverse effect of unspecified anesthetic, initial encounter: T41.45XA

## 2014-07-09 LAB — CBC
HEMATOCRIT: 38.9 % — AB (ref 39.0–52.0)
Hemoglobin: 12.8 g/dL — ABNORMAL LOW (ref 13.0–17.0)
MCH: 27.6 pg (ref 26.0–34.0)
MCHC: 32.9 g/dL (ref 30.0–36.0)
MCV: 83.8 fL (ref 78.0–100.0)
Platelets: 207 10*3/uL (ref 150–400)
RBC: 4.64 MIL/uL (ref 4.22–5.81)
RDW: 14.5 % (ref 11.5–15.5)
WBC: 4.9 10*3/uL (ref 4.0–10.5)

## 2014-07-09 LAB — BASIC METABOLIC PANEL
ANION GAP: 10 (ref 5–15)
BUN: 16 mg/dL (ref 6–23)
CALCIUM: 9.7 mg/dL (ref 8.4–10.5)
CO2: 28 meq/L (ref 19–32)
Chloride: 103 mEq/L (ref 96–112)
Creatinine, Ser: 1.06 mg/dL (ref 0.50–1.35)
GFR calc Af Amer: >90 mL/min (ref >90)
GFR calc non Af Amer: 80 mL/min — ABNORMAL LOW (ref >90)
Glucose, Bld: 142 mg/dL — ABNORMAL HIGH (ref 70–99)
Potassium: 4.7 mEq/L (ref 3.7–5.3)
Sodium: 141 mEq/L (ref 137–147)

## 2014-07-09 LAB — SURGICAL PCR SCREEN
MRSA, PCR: NEGATIVE
STAPHYLOCOCCUS AUREUS: NEGATIVE

## 2014-07-09 NOTE — Progress Notes (Signed)
EKG- 03/02/14 EPIC

## 2014-07-15 NOTE — H&P (Signed)
  History of Present Illness Andrew Gaines is a 48 year old with an elevated PSA of 5.56 and an apical prostate nodule who underwent a prostate needle biopsy on 05/18/14 which demonstrated Gleason 3+4=7 adenocarcinoma with 7 out of 12 biopsy cores positive for malignancy. He has no family history of prostate cancer.  TNM stage: cT2a Nx Mx PSA: 5.56 Gleason score: 3+4=7 Biopsy (05/18/14): 7/12 cores positive   Left: L base (25%, 3+3=6)   Right: R apex (10%, 20%, 3+3=6, PNI), R mid (40%, 3+3=6), R lateral mid (10%, 3+3=6, PNI), R base (30%, 3+3=6, PNI), R lateral base (80%< 3+4=7) Prostate volume: 37.4 cc  Nomogram OC disease: 55% EPE: 42% SVI: 7% LNI: 6% PFS (surgery): 81% at 5 years, 69% at 10 years  Urinary function: IPSS is 0. Erectile function: SHIM score is 25.   Past Medical History Problems  1. 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 History of deep venous thrombosis (Z86.718) 3. History of esophageal reflux (Z87.19) 4. History of hyperlipidemia (Z86.39) 5. History of hypertension (Z86.79) 6. History of optic neuritis (Z86.69) 7. History of pulmonary embolism (Z86.711)  Surgical History Problems  1. History of Knee Surgery 2. History of Knee Surgery 3. History of Shoulder Surgery  Current Meds 1. Aspirin 325 MG Oral Tablet;  Therapy: (Recorded:06Oct2015) to Recorded 2. Lexapro 10 MG Oral Tablet;  Therapy: (Recorded:23Sep2015) to Recorded 3. Lipitor 20 MG Oral Tablet;  Therapy: (Recorded:23Sep2015) to Recorded 4. Losartan Potassium 50 MG Oral Tablet;  Therapy: (Recorded:23Sep2015) to Recorded 5. Omeprazole 40 MG Oral Capsule Delayed Release;  Therapy: (Recorded:23Sep2015) to Recorded  Allergies Medication  1. No Known Drug Allergies  Family History Problems  1. Family history of Urinary calculus : Mother  Social History Problems  1. Alcohol use (F10.99)   drinks alcohol socially 2. Married 3. Never a smoker 4.  Occupation   Charity fundraiser Vital Signs [Data Includes: Last 1 Day]  Recorded: 06Oct2015 08:00AM  Blood Pressure: 131 / 92 Temperature: 98.4 F Heart Rate: 78  Physical Exam Constitutional: Well nourished and well developed . No acute distress.  Pulmonary: No respiratory distress and normal respiratory rhythm and effort.  Cardiovascular: Heart rate and rhythm are normal . No peripheral edema.     Assessed  1. Prostate cancer (C61)    Discussion/Summary 1. Prostate cancer:  After reviewing his treatment options in detail and considering his young age and long life expectancy, he adamantly wishes to proceed with surgical treatment. He will undergo a bilateral nerve sparing robotic-assisted laparoscopic radical prostatectomy and pelvic lymphadenectomy.

## 2014-07-19 NOTE — Anesthesia Preprocedure Evaluation (Addendum)
Anesthesia Evaluation  Patient identified by MRN, date of birth, ID band Patient awake    Reviewed: Allergy & Precautions, H&P , NPO status , Patient's Chart, lab work & pertinent test results  History of Anesthesia Complications (+) PONV  Airway Mallampati: III  TM Distance: >3 FB Neck ROM: full    Dental no notable dental hx. (+) Teeth Intact, Dental Advisory Given   Pulmonary shortness of breath and with exertion, sleep apnea , PE breath sounds clear to auscultation  Pulmonary exam normal       Cardiovascular Exercise Tolerance: Good hypertension, Pt. on medications Rhythm:regular Rate:Normal     Neuro/Psych negative neurological ROS  negative psych ROS   GI/Hepatic negative GI ROS, Neg liver ROS, hiatal hernia, GERD-  Medicated and Controlled,  Endo/Other  negative endocrine ROS  Renal/GU negative Renal ROS  negative genitourinary   Musculoskeletal   Abdominal   Peds  Hematology negative hematology ROS (+)   Anesthesia Other Findings   Reproductive/Obstetrics negative OB ROS                            Anesthesia Physical Anesthesia Plan  ASA: III  Anesthesia Plan: General   Post-op Pain Management:    Induction: Intravenous  Airway Management Planned: Oral ETT  Additional Equipment:   Intra-op Plan:   Post-operative Plan: Extubation in OR  Informed Consent: I have reviewed the patients History and Physical, chart, labs and discussed the procedure including the risks, benefits and alternatives for the proposed anesthesia with the patient or authorized representative who has indicated his/her understanding and acceptance.   Dental Advisory Given  Plan Discussed with: CRNA and Surgeon  Anesthesia Plan Comments:         Anesthesia Quick Evaluation

## 2014-07-20 ENCOUNTER — Inpatient Hospital Stay (HOSPITAL_COMMUNITY): Payer: BC Managed Care – PPO | Admitting: Anesthesiology

## 2014-07-20 ENCOUNTER — Inpatient Hospital Stay (HOSPITAL_COMMUNITY)
Admission: RE | Admit: 2014-07-20 | Discharge: 2014-07-21 | DRG: 708 | Disposition: A | Discharge status: Home / Self Care | Payer: BC Managed Care – PPO | Source: Ambulatory Visit | Attending: Urology | Admitting: Urology

## 2014-07-20 ENCOUNTER — Encounter (HOSPITAL_COMMUNITY): Payer: Self-pay | Admitting: *Deleted

## 2014-07-20 ENCOUNTER — Encounter (HOSPITAL_COMMUNITY)
Admission: RE | Disposition: A | Discharge status: Home / Self Care | Payer: Self-pay | Source: Ambulatory Visit | Attending: Urology

## 2014-07-20 DIAGNOSIS — C61 Malignant neoplasm of prostate: Secondary | ICD-10-CM | POA: Diagnosis present

## 2014-07-20 DIAGNOSIS — E785 Hyperlipidemia, unspecified: Secondary | ICD-10-CM | POA: Diagnosis present

## 2014-07-20 DIAGNOSIS — I10 Essential (primary) hypertension: Secondary | ICD-10-CM | POA: Diagnosis present

## 2014-07-20 DIAGNOSIS — K219 Gastro-esophageal reflux disease without esophagitis: Secondary | ICD-10-CM | POA: Diagnosis present

## 2014-07-20 DIAGNOSIS — Z7982 Long term (current) use of aspirin: Secondary | ICD-10-CM | POA: Diagnosis not present

## 2014-07-20 DIAGNOSIS — Z86718 Personal history of other venous thrombosis and embolism: Secondary | ICD-10-CM | POA: Diagnosis not present

## 2014-07-20 DIAGNOSIS — Z86711 Personal history of pulmonary embolism: Secondary | ICD-10-CM

## 2014-07-20 HISTORY — PX: LYMPHADENECTOMY: SHX5960

## 2014-07-20 HISTORY — PX: ROBOT ASSISTED LAPAROSCOPIC RADICAL PROSTATECTOMY: SHX5141

## 2014-07-20 LAB — HEMOGLOBIN AND HEMATOCRIT, BLOOD
HEMATOCRIT: 38.2 % — AB (ref 39.0–52.0)
Hemoglobin: 12.1 g/dL — ABNORMAL LOW (ref 13.0–17.0)

## 2014-07-20 LAB — TYPE AND SCREEN
ABO/RH(D): A POS
Antibody Screen: NEGATIVE

## 2014-07-20 LAB — ABO/RH: ABO/RH(D): A POS

## 2014-07-20 SURGERY — ROBOTIC ASSISTED LAPAROSCOPIC RADICAL PROSTATECTOMY LEVEL 2
Anesthesia: General

## 2014-07-20 MED ORDER — BUPIVACAINE-EPINEPHRINE 0.25% -1:200000 IJ SOLN
INTRAMUSCULAR | Status: DC | PRN
Start: 2014-07-20 — End: 2014-07-20
  Administered 2014-07-20: 30 mL

## 2014-07-20 MED ORDER — GLYCOPYRROLATE 0.2 MG/ML IJ SOLN
INTRAMUSCULAR | Status: DC | PRN
  Administered 2014-07-20: 0.4 mg via INTRAVENOUS

## 2014-07-20 MED ORDER — DEXAMETHASONE SODIUM PHOSPHATE 10 MG/ML IJ SOLN
INTRAMUSCULAR | Status: AC
  Filled 2014-07-20: qty 1

## 2014-07-20 MED ORDER — PROPOFOL 10 MG/ML IV BOLUS
INTRAVENOUS | Status: DC | PRN
  Administered 2014-07-20: 200 mg via INTRAVENOUS

## 2014-07-20 MED ORDER — FENTANYL CITRATE 0.05 MG/ML IJ SOLN
INTRAMUSCULAR | Status: AC
  Filled 2014-07-20: qty 5

## 2014-07-20 MED ORDER — NEOSTIGMINE METHYLSULFATE 10 MG/10ML IV SOLN
INTRAVENOUS | Status: AC
  Filled 2014-07-20: qty 1

## 2014-07-20 MED ORDER — HYDROMORPHONE HCL 2 MG/ML IJ SOLN
INTRAMUSCULAR | Status: AC
  Filled 2014-07-20: qty 1

## 2014-07-20 MED ORDER — KETOROLAC TROMETHAMINE 15 MG/ML IJ SOLN
15.0000 mg | Freq: Four times a day (QID) | INTRAMUSCULAR | Status: DC
  Administered 2014-07-20 – 2014-07-21 (×5): 15 mg via INTRAVENOUS
  Filled 2014-07-20 (×7): qty 1

## 2014-07-20 MED ORDER — KCL IN DEXTROSE-NACL 20-5-0.45 MEQ/L-%-% IV SOLN
INTRAVENOUS | Status: DC
Start: 2014-07-20 — End: 2014-07-21
  Administered 2014-07-20 – 2014-07-21 (×3): via INTRAVENOUS
  Filled 2014-07-20 (×4): qty 1000

## 2014-07-20 MED ORDER — HYDROMORPHONE HCL 1 MG/ML IJ SOLN
INTRAMUSCULAR | Status: AC
  Filled 2014-07-20: qty 1

## 2014-07-20 MED ORDER — BUPIVACAINE-EPINEPHRINE (PF) 0.25% -1:200000 IJ SOLN
INTRAMUSCULAR | Status: AC
  Filled 2014-07-20: qty 30

## 2014-07-20 MED ORDER — MORPHINE SULFATE 2 MG/ML IJ SOLN
2.0000 mg | INTRAMUSCULAR | Status: DC | PRN
  Administered 2014-07-20: 2 mg via INTRAVENOUS
  Administered 2014-07-20 – 2014-07-21 (×4): 4 mg via INTRAVENOUS
  Filled 2014-07-20: qty 2
  Filled 2014-07-20: qty 1
  Filled 2014-07-20 (×3): qty 2

## 2014-07-20 MED ORDER — MIDAZOLAM HCL 2 MG/2ML IJ SOLN
INTRAMUSCULAR | Status: AC
  Filled 2014-07-20: qty 2

## 2014-07-20 MED ORDER — DOCUSATE SODIUM 100 MG PO CAPS
100.0000 mg | ORAL_CAPSULE | Freq: Two times a day (BID) | ORAL | Status: DC
  Administered 2014-07-20 – 2014-07-21 (×2): 100 mg via ORAL
  Filled 2014-07-20 (×2): qty 1

## 2014-07-20 MED ORDER — LACTATED RINGERS IV SOLN
INTRAVENOUS | Status: DC | PRN
  Administered 2014-07-20: 1 mL

## 2014-07-20 MED ORDER — NEOSTIGMINE METHYLSULFATE 10 MG/10ML IV SOLN
INTRAVENOUS | Status: DC | PRN
  Administered 2014-07-20: 4 mg via INTRAVENOUS

## 2014-07-20 MED ORDER — SODIUM CHLORIDE 0.9 % IR SOLN
Status: DC | PRN
  Administered 2014-07-20: 1000 mL via INTRAVESICAL

## 2014-07-20 MED ORDER — HYDROMORPHONE HCL 1 MG/ML IJ SOLN
INTRAMUSCULAR | Status: AC
  Administered 2014-07-20: 0.5 mg via INTRAVENOUS
  Filled 2014-07-20: qty 1

## 2014-07-20 MED ORDER — PROPOFOL 10 MG/ML IV BOLUS
INTRAVENOUS | Status: AC
  Filled 2014-07-20: qty 20

## 2014-07-20 MED ORDER — ATORVASTATIN CALCIUM 20 MG PO TABS
20.0000 mg | ORAL_TABLET | Freq: Every day | ORAL | Status: DC
  Administered 2014-07-20 – 2014-07-21 (×2): 20 mg via ORAL
  Filled 2014-07-20 (×2): qty 1

## 2014-07-20 MED ORDER — FENTANYL CITRATE 0.05 MG/ML IJ SOLN
INTRAMUSCULAR | Status: DC | PRN
  Administered 2014-07-20: 150 ug via INTRAVENOUS
  Administered 2014-07-20: 100 ug via INTRAVENOUS

## 2014-07-20 MED ORDER — CIPROFLOXACIN HCL 500 MG PO TABS: 500.0000 mg | ORAL_TABLET | Freq: Two times a day (BID) | ORAL | Status: DC

## 2014-07-20 MED ORDER — ACETAMINOPHEN 325 MG PO TABS
650.0000 mg | ORAL_TABLET | ORAL | Status: DC | PRN
  Administered 2014-07-21: 650 mg via ORAL
  Filled 2014-07-20: qty 2

## 2014-07-20 MED ORDER — ONDANSETRON HCL 4 MG/2ML IJ SOLN
INTRAMUSCULAR | Status: DC | PRN
Start: 2014-07-20 — End: 2014-07-20
  Administered 2014-07-20: 4 mg via INTRAVENOUS

## 2014-07-20 MED ORDER — CEFAZOLIN SODIUM-DEXTROSE 2-3 GM-% IV SOLR
2.0000 g | INTRAVENOUS | Status: AC
  Administered 2014-07-20: 2 g via INTRAVENOUS

## 2014-07-20 MED ORDER — HEPARIN SODIUM (PORCINE) 5000 UNIT/ML IJ SOLN
5000.0000 [IU] | Freq: Three times a day (TID) | INTRAMUSCULAR | Status: DC
  Administered 2014-07-20 – 2014-07-21 (×3): 5000 [IU] via SUBCUTANEOUS
  Filled 2014-07-20 (×4): qty 1

## 2014-07-20 MED ORDER — KCL IN DEXTROSE-NACL 20-5-0.45 MEQ/L-%-% IV SOLN
INTRAVENOUS | Status: AC
  Filled 2014-07-20: qty 1000

## 2014-07-20 MED ORDER — LIDOCAINE HCL (CARDIAC) 20 MG/ML IV SOLN
INTRAVENOUS | Status: AC
  Filled 2014-07-20: qty 5

## 2014-07-20 MED ORDER — LACTATED RINGERS IV SOLN
INTRAVENOUS | Status: DC | PRN
  Administered 2014-07-20 (×2): via INTRAVENOUS

## 2014-07-20 MED ORDER — HYDROMORPHONE HCL 1 MG/ML IJ SOLN
INTRAMUSCULAR | Status: DC | PRN
  Administered 2014-07-20 (×2): 1 mg via INTRAVENOUS

## 2014-07-20 MED ORDER — CEFAZOLIN SODIUM-DEXTROSE 2-3 GM-% IV SOLR
INTRAVENOUS | Status: AC
  Filled 2014-07-20: qty 50

## 2014-07-20 MED ORDER — CISATRACURIUM BESYLATE 20 MG/10ML IV SOLN
INTRAVENOUS | Status: AC
  Filled 2014-07-20: qty 10

## 2014-07-20 MED ORDER — SODIUM CHLORIDE 0.9 % IV BOLUS (SEPSIS)
1000.0000 mL | Freq: Once | INTRAVENOUS | Status: AC
  Administered 2014-07-20: 1000 mL via INTRAVENOUS

## 2014-07-20 MED ORDER — SUCCINYLCHOLINE CHLORIDE 20 MG/ML IJ SOLN
INTRAMUSCULAR | Status: DC | PRN
  Administered 2014-07-20: 100 mg via INTRAVENOUS

## 2014-07-20 MED ORDER — HEPARIN SODIUM (PORCINE) 1000 UNIT/ML IJ SOLN
INTRAMUSCULAR | Status: AC
  Filled 2014-07-20: qty 1

## 2014-07-20 MED ORDER — ONDANSETRON HCL 4 MG/2ML IJ SOLN
INTRAMUSCULAR | Status: AC
  Filled 2014-07-20: qty 2

## 2014-07-20 MED ORDER — MIDAZOLAM HCL 5 MG/5ML IJ SOLN
INTRAMUSCULAR | Status: DC | PRN
  Administered 2014-07-20: 2 mg via INTRAVENOUS

## 2014-07-20 MED ORDER — ONDANSETRON HCL 4 MG/2ML IJ SOLN
4.0000 mg | INTRAMUSCULAR | Status: DC | PRN
  Administered 2014-07-20: 4 mg via INTRAVENOUS
  Filled 2014-07-20: qty 2

## 2014-07-20 MED ORDER — LACTATED RINGERS IV SOLN
INTRAVENOUS | Status: DC
  Administered 2014-07-20: 1000 mL via INTRAVENOUS

## 2014-07-20 MED ORDER — HYDROMORPHONE HCL 1 MG/ML IJ SOLN
0.2500 mg | INTRAMUSCULAR | Status: DC | PRN
  Administered 2014-07-20 (×4): 0.5 mg via INTRAVENOUS

## 2014-07-20 MED ORDER — DIPHENHYDRAMINE HCL 12.5 MG/5ML PO ELIX: 12.5000 mg | ORAL_SOLUTION | Freq: Four times a day (QID) | ORAL | Status: DC | PRN

## 2014-07-20 MED ORDER — ESCITALOPRAM OXALATE 10 MG PO TABS
10.0000 mg | ORAL_TABLET | Freq: Every morning | ORAL | Status: DC
Start: 2014-07-21 — End: 2014-07-21
  Administered 2014-07-21: 10 mg via ORAL
  Filled 2014-07-20: qty 1

## 2014-07-20 MED ORDER — PANTOPRAZOLE SODIUM 40 MG PO TBEC
80.0000 mg | DELAYED_RELEASE_TABLET | Freq: Every day | ORAL | Status: DC
  Administered 2014-07-20 – 2014-07-21 (×2): 80 mg via ORAL
  Filled 2014-07-20 (×2): qty 2

## 2014-07-20 MED ORDER — HYDROCODONE-ACETAMINOPHEN 5-325 MG PO TABS: 1.0000 | ORAL_TABLET | Freq: Four times a day (QID) | ORAL | Status: DC | PRN

## 2014-07-20 MED ORDER — DIPHENHYDRAMINE HCL 50 MG/ML IJ SOLN: 12.5000 mg | Freq: Four times a day (QID) | INTRAMUSCULAR | Status: DC | PRN

## 2014-07-20 MED ORDER — DEXAMETHASONE SODIUM PHOSPHATE 10 MG/ML IJ SOLN
INTRAMUSCULAR | Status: DC | PRN
  Administered 2014-07-20: 10 mg via INTRAVENOUS

## 2014-07-20 MED ORDER — BELLADONNA ALKALOIDS-OPIUM 16.2-60 MG RE SUPP
1.0000 | Freq: Four times a day (QID) | RECTAL | Status: DC | PRN
  Administered 2014-07-20 – 2014-07-21 (×3): 1 via RECTAL
  Filled 2014-07-20 (×3): qty 1

## 2014-07-20 MED ORDER — CISATRACURIUM BESYLATE (PF) 10 MG/5ML IV SOLN
INTRAVENOUS | Status: DC | PRN
  Administered 2014-07-20: 10 mg via INTRAVENOUS
  Administered 2014-07-20: 6 mg via INTRAVENOUS

## 2014-07-20 MED ORDER — LIDOCAINE HCL 1 % IJ SOLN
INTRAMUSCULAR | Status: DC | PRN
  Administered 2014-07-20: 100 mg via INTRADERMAL

## 2014-07-20 MED ORDER — GLYCOPYRROLATE 0.2 MG/ML IJ SOLN
INTRAMUSCULAR | Status: AC
  Filled 2014-07-20: qty 2

## 2014-07-20 MED ORDER — CEFAZOLIN SODIUM 1-5 GM-% IV SOLN
1.0000 g | Freq: Three times a day (TID) | INTRAVENOUS | Status: AC
  Administered 2014-07-20 (×2): 1 g via INTRAVENOUS
  Filled 2014-07-20 (×2): qty 50

## 2014-07-20 SURGICAL SUPPLY — 48 items
CABLE HIGH FREQUENCY MONO STRZ (ELECTRODE) ×3 IMPLANT
CATH FOLEY 2WAY SLVR 18FR 30CC (CATHETERS) ×3 IMPLANT
CATH ROBINSON RED A/P 16FR (CATHETERS) ×3 IMPLANT
CATH ROBINSON RED A/P 8FR (CATHETERS) ×3 IMPLANT
CATH TIEMANN FOLEY 18FR 5CC (CATHETERS) ×3 IMPLANT
CHLORAPREP W/TINT 26ML (MISCELLANEOUS) ×3 IMPLANT
CLIP LIGATING HEM O LOK PURPLE (MISCELLANEOUS) ×9 IMPLANT
CLOTH BEACON ORANGE TIMEOUT ST (SAFETY) ×3 IMPLANT
COVER SURGICAL LIGHT HANDLE (MISCELLANEOUS) ×3 IMPLANT
COVER TIP SHEARS 8 DVNC (MISCELLANEOUS) ×2 IMPLANT
COVER TIP SHEARS 8MM DA VINCI (MISCELLANEOUS) ×1
CUTTER ECHEON FLEX ENDO 45 340 (ENDOMECHANICALS) ×3 IMPLANT
DECANTER SPIKE VIAL GLASS SM (MISCELLANEOUS) IMPLANT
DRAPE SURG IRRIG POUCH 19X23 (DRAPES) ×3 IMPLANT
DRSG TEGADERM 4X4.75 (GAUZE/BANDAGES/DRESSINGS) ×3 IMPLANT
DRSG TEGADERM 6X8 (GAUZE/BANDAGES/DRESSINGS) ×6 IMPLANT
ELECT REM PT RETURN 9FT ADLT (ELECTROSURGICAL) ×3
ELECTRODE REM PT RTRN 9FT ADLT (ELECTROSURGICAL) ×2 IMPLANT
GLOVE BIO SURGEON STRL SZ 6.5 (GLOVE) ×3 IMPLANT
GLOVE BIOGEL M STRL SZ7.5 (GLOVE) ×21 IMPLANT
GOWN STRL REUS W/TWL LRG LVL3 (GOWN DISPOSABLE) ×15 IMPLANT
HOLDER FOLEY CATH W/STRAP (MISCELLANEOUS) ×3 IMPLANT
IV LACTATED RINGERS 1000ML (IV SOLUTION) IMPLANT
KIT ACCESSORY DA VINCI DISP (KITS) ×1
KIT ACCESSORY DVNC DISP (KITS) ×2 IMPLANT
LIQUID BAND (GAUZE/BANDAGES/DRESSINGS) ×3 IMPLANT
MANIFOLD NEPTUNE II (INSTRUMENTS) ×3 IMPLANT
NDL SAFETY ECLIPSE 18X1.5 (NEEDLE) ×2 IMPLANT
NEEDLE HYPO 18GX1.5 SHARP (NEEDLE) ×2
PACK ROBOT UROLOGY CUSTOM (CUSTOM PROCEDURE TRAY) ×3 IMPLANT
RELOAD GREEN ECHELON 45 (STAPLE) ×3 IMPLANT
SET TUBE IRRIG SUCTION NO TIP (IRRIGATION / IRRIGATOR) ×3 IMPLANT
SOLUTION ELECTROLUBE (MISCELLANEOUS) ×3 IMPLANT
SUT ETHILON 3 0 PS 1 (SUTURE) ×3 IMPLANT
SUT MNCRL 3 0 RB1 (SUTURE) ×2 IMPLANT
SUT MNCRL 3 0 VIOLET RB1 (SUTURE) ×6 IMPLANT
SUT MNCRL AB 4-0 PS2 18 (SUTURE) ×6 IMPLANT
SUT MONOCRYL 3 0 RB1 (SUTURE) ×4
SUT VIC AB 0 CT1 27 (SUTURE) ×3
SUT VIC AB 0 CT1 27XBRD ANTBC (SUTURE) ×2 IMPLANT
SUT VIC AB 0 UR5 27 (SUTURE) ×3 IMPLANT
SUT VIC AB 2-0 SH 27 (SUTURE) ×2
SUT VIC AB 2-0 SH 27X BRD (SUTURE) ×2 IMPLANT
SUT VICRYL 0 UR6 27IN ABS (SUTURE) ×6 IMPLANT
SYR 27GX1/2 1ML LL SAFETY (SYRINGE) ×3 IMPLANT
TOWEL OR 17X26 10 PK STRL BLUE (TOWEL DISPOSABLE) ×3 IMPLANT
TOWEL OR NON WOVEN STRL DISP B (DISPOSABLE) ×3 IMPLANT
WATER STERILE IRR 1500ML POUR (IV SOLUTION) ×6 IMPLANT

## 2014-07-20 NOTE — Progress Notes (Signed)
Patient ID: Andrew Gaines, male   DOB: [birth year], 48 y.o.   MRN: [id number] Post-op note  Subjective: The patient is doing well.  No complaints.  BP elevated earlier and pt states he was in pain.  Feels better now.  Objective: Vital signs in last 24 hours: Temp:  [97.5 F (36.4 C)-97.6 F (36.4 C)] 97.5 F (36.4 C) (11/30 1200) Pulse Rate:  [61-95] 95 (11/30 1200) Resp:  [9-17] 14 (11/30 1200) BP: (143-163)/(86-107) 143/102 mmHg (11/30 1200) SpO2:  [92 %-100 %] 99 % (11/30 1200) Weight:  [113.399 kg (250 lb)-113.853 kg (251 lb)] 113.399 kg (250 lb) (11/30 1326)  Intake/Output from previous day:   Intake/Output this shift: Total I/O In: 1500 [I.V.:1500] Out: 245 [Urine:30; Drains:65; Blood:150]  Physical Exam:  General: Alert and oriented. Abdomen: Soft, Nondistended. Incisions: Clean and dry. Urine: red  Lab Results:  Recent Labs  07/20/14 1039  HGB 12.1*  HCT 38.2*    Assessment/Plan: POD#0   1) Continue to monitor  2) DVT prophy, clears, IS, amb, pain control  3) Monitor BP; recheck now that pain is better controlled.   LOS: 0 days   [place] 07/20/2014, 2:42 PM

## 2014-07-20 NOTE — Plan of Care (Signed)
Problem: Phase I Progression Outcomes Goal: Walk in halls when awake from anesthesia Outcome: Completed/Met Date Met:  07/20/14 Goal: Initial discharge plan identified Outcome: Completed/Met Date Met:  07/20/14 Goal: Hemodynamically stable Outcome: Completed/Met Date Met:  07/20/14 Goal: Other Phase I Outcomes/Goals Outcome: Completed/Met Date Met:  07/20/14

## 2014-07-20 NOTE — Op Note (Signed)

## 2014-07-20 NOTE — Anesthesia Postprocedure Evaluation (Signed)
  Anesthesia Post-op Note  Patient: Andrew Gaines  Procedure(s) Performed: Procedure(s) (LRB): ROBOTIC ASSISTED LAPAROSCOPIC RADICAL PROSTATECTOMY LEVEL 2 (N/A) LYMPHADENECTOMY (Bilateral)  Patient Location: PACU  Anesthesia Type: General  Level of Consciousness: awake and alert   Airway and Oxygen Therapy: Patient Spontanous Breathing  Post-op Pain: mild  Post-op Assessment: Post-op Vital signs reviewed, Patient's Cardiovascular Status Stable, Respiratory Function Stable, Patent Airway and No signs of Nausea or vomiting  Last Vitals:  Filed Vitals:   07/20/14 1145  BP: 160/86  Pulse: 64  Temp: 36.4 C  Resp: 9    Post-op Vital Signs: stable   Complications: No apparent anesthesia complications

## 2014-07-20 NOTE — Discharge Instructions (Signed)

## 2014-07-20 NOTE — Transfer of Care (Signed)
Immediate Anesthesia Transfer of Care Note  Patient: Andrew Gaines  Procedure(s) Performed: Procedure(s): ROBOTIC ASSISTED LAPAROSCOPIC RADICAL PROSTATECTOMY LEVEL 2 (N/A) LYMPHADENECTOMY (Bilateral)  Patient Location: PACU  Anesthesia Type:General  Level of Consciousness: awake, alert  and oriented  Airway & Oxygen Therapy: Patient Spontanous Breathing and Patient connected to face mask oxygen  Post-op Assessment: Report given to PACU RN  Post vital signs: Reviewed and stable  Complications: No apparent anesthesia complications

## 2014-07-20 NOTE — Plan of Care (Signed)
Problem: Phase I Progression Outcomes Goal: Foley/JP patent Outcome: Completed/Met Date Met:  07/20/14 Goal: Incision/dressing intact Outcome: Completed/Met Date Met:  07/20/14 Goal: Adequate I & O Outcome: Completed/Met Date Met:  07/20/14

## 2014-07-21 ENCOUNTER — Encounter (HOSPITAL_COMMUNITY): Payer: Self-pay | Admitting: Urology

## 2014-07-21 LAB — HEMOGLOBIN AND HEMATOCRIT, BLOOD
HEMATOCRIT: 34.9 % — AB (ref 39.0–52.0)
HEMOGLOBIN: 11.2 g/dL — AB (ref 13.0–17.0)

## 2014-07-21 MED ORDER — HYDROCODONE-ACETAMINOPHEN 5-325 MG PO TABS
1.0000 | ORAL_TABLET | Freq: Four times a day (QID) | ORAL | Status: DC | PRN
  Administered 2014-07-21 (×2): 2 via ORAL
  Filled 2014-07-21 (×2): qty 2

## 2014-07-21 MED ORDER — BISACODYL 10 MG RE SUPP
10.0000 mg | Freq: Once | RECTAL | Status: AC
  Administered 2014-07-21: 10 mg via RECTAL
  Filled 2014-07-21: qty 1

## 2014-07-21 NOTE — Discharge Summary (Signed)
  Date of admission: 07/20/2014  Date of discharge: 07/21/2014  Admission diagnosis: Prostate Cancer  Discharge diagnosis: Prostate Cancer  History and Physical: For full details, please see admission history and physical. Briefly, Andrew Gaines is a 48 y.o. gentleman with localized prostate cancer.  After discussing management/treatment options, he elected to proceed with surgical treatment.  Hospital Course: DAMONTE FRIESON was taken to the operating room on 07/20/2014 and underwent a robotic assisted laparoscopic radical prostatectomy. He tolerated this procedure well and without complications. Postoperatively, he was able to be transferred to a regular hospital room following recovery from anesthesia.  He was able to begin ambulating the night of surgery. He remained hemodynamically stable overnight.  He had excellent urine output with appropriately minimal output from his pelvic drain and his pelvic drain was removed on POD #1.  He was transitioned to oral pain medication, tolerated a clear liquid diet, and had met all discharge criteria and was able to be discharged home later on POD#1.  Laboratory values:  Recent Labs  07/20/14 1039 07/21/14 0510  HGB 12.1* 11.2*  HCT 38.2* 34.9*    Disposition: Home  Discharge instruction: He was instructed to be ambulatory but to refrain from heavy lifting, strenuous activity, or driving. He was instructed on urethral catheter care.  Discharge medications:     Medication List    STOP taking these medications        aspirin 325 MG tablet     ibuprofen 200 MG tablet  Commonly known as:  ADVIL,MOTRIN      TAKE these medications        acetaminophen 500 MG tablet  Commonly known as:  TYLENOL  Take 1,000 mg by mouth every 6 (six) hours as needed for mild pain or moderate pain.     atorvastatin 20 MG tablet  Commonly known as:  LIPITOR  Take 1 tablet (20 mg total) by mouth daily.     ciprofloxacin 500 MG tablet  Commonly known as:   CIPRO  Take 1 tablet (500 mg total) by mouth 2 (two) times daily. Start day prior to office visit for foley removal     escitalopram 10 MG tablet  Commonly known as:  LEXAPRO  Take 10 mg by mouth every morning.     guanFACINE 2 MG Tb24 SR tablet  Commonly known as:  INTUNIV  Take 2 mg by mouth daily.     HYDROcodone-acetaminophen 5-325 MG per tablet  Commonly known as:  NORCO  Take 1-2 tablets by mouth every 6 (six) hours as needed.     losartan 50 MG tablet  Commonly known as:  COZAAR  1 by mouth every morning     omeprazole 40 MG capsule  Commonly known as:  PRILOSEC  1 by mouth each bedtime        Followup: He will followup in 1 week for catheter removal and to discuss his surgical pathology results.

## 2014-07-21 NOTE — Care Management Note (Signed)
    Page 1 of 1   07/21/2014     2:17:24 PM CARE MANAGEMENT NOTE 07/21/2014  Patient:  HODGE, STACHNIK   Account Number:  [id number]  Date Initiated:  07/21/2014  Documentation initiated by:  [facility]  Subjective/Objective Assessment:   48 Y/O M ADMITTED W/PROSTATE CA.     Action/Plan:   FROM HOME.   Anticipated DC Date:  07/21/2014   Anticipated DC Plan:  [facility]  CM consult      Choice offered to / List presented to:             Status of service:  Completed, signed off Medicare Important Message given?   (If response is "NO", the following Medicare IM given date fields will be blank) Date Medicare IM given:   Medicare IM given by:   Date Additional Medicare IM given:   Additional Medicare IM given by:    Discharge Disposition:  HOME/SELF CARE  Per UR Regulation:  Reviewed for med. necessity/level of care/duration of stay  If discussed at [place] of Stay Meetings, dates discussed:    Comments:  07/21/14 Sheyanne Munley RN,BSN NCM [phone] S/P PROSTATECTOMY.D/C HOME NO NEEDS OR ORDERS.

## 2014-07-21 NOTE — Progress Notes (Signed)
Patient ID: Andrew Gaines, male   DOB: Jan 05, 1966, 48 y.o.   MRN: [id number]  1 Day Post-Op Subjective: The patient is doing well.  No nausea or vomiting. Pain is adequately controlled.  Objective: Vital signs in last 24 hours: Temp:  [97.5 F (36.4 C)-98.2 F (36.8 C)] 98.1 F (36.7 C) (12/01 0408) Pulse Rate:  [60-96] 76 (12/01 0408) Resp:  [9-18] 18 (12/01 0408) BP: (114-163)/(74-107) 125/79 mmHg (12/01 0408) SpO2:  [92 %-100 %] 95 % (12/01 0408) Weight:  [113.399 kg (250 lb)] 113.399 kg (250 lb) (11/30 1326)  Intake/Output from previous day: 11/30 0701 - 12/01 0700 In: 4270 [P.O.:480; I.V.:3690; IV Piggyback:100] Out: 3200 [Urine:2830; Drains:220; Blood:150] Intake/Output this shift:    Physical Exam:  General: Alert and oriented. CV: RRR Lungs: Clear bilaterally. GI: Soft, Nondistended. Incisions: Clean, dry, and intact Urine: Clear Extremities: Nontender, no erythema, no edema.  Lab Results:  Recent Labs  07/20/14 1039 07/21/14 0510  HGB 12.1* 11.2*  HCT 38.2* 34.9*      Assessment/Plan: POD# 1 s/p robotic prostatectomy.  1) SL IVF 2) Ambulate, Incentive spirometry 3) Transition to oral pain medication 4) Dulcolax suppository 5) D/C pelvic drain 6) Plan for likely discharge later today   Pryor Curia. MD   LOS: 1 day   Andrew Gaines,Andrew Gaines 07/21/2014, 7:29 AM

## 2014-07-21 NOTE — Progress Notes (Signed)
Foley bag teaching completed with patient and wife.  No further questions at this time.  Patient is ready to go home.

## 2015-02-15 ENCOUNTER — Encounter: Payer: Self-pay | Admitting: Family Medicine

## 2015-02-15 ENCOUNTER — Ambulatory Visit (INDEPENDENT_AMBULATORY_CARE_PROVIDER_SITE_OTHER): Payer: BLUE CROSS/BLUE SHIELD | Admitting: Family Medicine

## 2015-02-15 VITALS — BP 142/80 | HR 61 | Temp 98.6°F | Ht 72.0 in | Wt 261.0 lb

## 2015-02-15 DIAGNOSIS — G243 Spasmodic torticollis: Secondary | ICD-10-CM

## 2015-02-15 MED ORDER — CYCLOBENZAPRINE HCL 10 MG PO TABS: 10.0000 mg | ORAL_TABLET | Freq: Three times a day (TID) | ORAL | Status: DC | PRN

## 2015-02-15 NOTE — Progress Notes (Signed)
   Subjective:    Patient ID: Andrew Gaines, male    DOB: [birth year], 49 y.o.   MRN: [id number]  HPI Here for 2 days of stiffness and pain in the neck. He woke up with this the other morning. Using heat, Icy Hot, and Ibuproifen with mixed results.    Review of Systems  Constitutional: Negative.   Respiratory: Negative.   Cardiovascular: Negative.   Musculoskeletal: Positive for neck pain and neck stiffness.       Objective:   Physical Exam  Constitutional: He appears well-developed and well-nourished.  Cardiovascular: Normal rate, regular rhythm, normal heart sounds and intact distal pulses.   Pulmonary/Chest: Effort normal and breath sounds normal.  Musculoskeletal:  Tender in the right posterior neck with spasm and reduced ROM          Assessment & Plan:  Torticollis. Add Flexeril prn.

## 2015-02-15 NOTE — Progress Notes (Signed)
Pre visit review using our clinic review tool, if applicable. No additional management support is needed unless otherwise documented below in the visit note. 

## 2015-03-12 ENCOUNTER — Telehealth: Payer: Self-pay | Admitting: Family Medicine

## 2015-03-12 ENCOUNTER — Other Ambulatory Visit: Payer: Self-pay | Admitting: Family Medicine

## 2015-03-12 NOTE — Telephone Encounter (Signed)
Pt request refill of the following: atorvastatin (LIPITOR) 20 MG tablet   Pham[facility]: [facility]  [address]

## 2015-03-12 NOTE — Telephone Encounter (Signed)
rx sent

## 2015-04-15 ENCOUNTER — Encounter: Payer: Self-pay | Admitting: Internal Medicine

## 2015-04-22 ENCOUNTER — Other Ambulatory Visit: Payer: Self-pay | Admitting: Family Medicine

## 2015-06-05 ENCOUNTER — Other Ambulatory Visit: Payer: Self-pay | Admitting: Family Medicine

## 2015-06-30 ENCOUNTER — Other Ambulatory Visit: Payer: Self-pay | Admitting: Family Medicine

## 2015-07-08 DIAGNOSIS — E66811 Obesity, class 1: Secondary | ICD-10-CM | POA: Insufficient documentation

## 2015-07-08 DIAGNOSIS — E119 Type 2 diabetes mellitus without complications: Secondary | ICD-10-CM | POA: Insufficient documentation

## 2015-09-18 ENCOUNTER — Other Ambulatory Visit: Payer: Self-pay | Admitting: Family Medicine

## 2015-09-30 ENCOUNTER — Other Ambulatory Visit: Payer: Self-pay | Admitting: Family Medicine

## 2015-12-29 ENCOUNTER — Other Ambulatory Visit: Payer: Self-pay | Admitting: Family Medicine

## 2016-01-18 ENCOUNTER — Other Ambulatory Visit: Payer: Self-pay | Admitting: Family Medicine

## 2016-01-28 ENCOUNTER — Other Ambulatory Visit: Payer: Self-pay

## 2016-01-28 ENCOUNTER — Ambulatory Visit (INDEPENDENT_AMBULATORY_CARE_PROVIDER_SITE_OTHER): Payer: BLUE CROSS/BLUE SHIELD | Admitting: Adult Health

## 2016-01-28 ENCOUNTER — Encounter: Payer: Self-pay | Admitting: Adult Health

## 2016-01-28 VITALS — BP 130/84 | Temp 98.1°F | Ht 72.0 in | Wt 255.6 lb

## 2016-01-28 DIAGNOSIS — E785 Hyperlipidemia, unspecified: Secondary | ICD-10-CM

## 2016-01-28 MED ORDER — ATORVASTATIN CALCIUM 20 MG PO TABS: 20.0000 mg | ORAL_TABLET | Freq: Every day | ORAL | Status: DC

## 2016-01-28 NOTE — Patient Instructions (Signed)
It was great meeting you today!  I have sent in a prescription for Lipitor.   Please follow up with myself or one of the other providers for a physical this summer

## 2016-01-28 NOTE — Progress Notes (Signed)
Subjective:    Patient ID: Andrew Gaines, male    DOB: 1966/07/09, 50 y.o.   MRN: [id number]  HPI  50 year old male  has a past medical history of GERD (gastroesophageal reflux disease); Unspecified essential hypertension; Unspecified sinusitis (chronic); Hiatal hernia; Complication of anesthesia; PONV (postoperative nausea and vomiting); and Cancer ([place]). He is a patient of Dr. Sherren Mocha, who has not been seen in two years.   He presents to the office today for medication refill. He needs his Lipitor refilled.   He denies any acute issues.    Review of Systems  Constitutional: Negative.   Respiratory: Negative.   Cardiovascular: Negative.   Neurological: Negative.   All other systems reviewed and are negative.  Past Medical History  Diagnosis Date  . GERD (gastroesophageal reflux disease)   . Unspecified essential hypertension   . Unspecified sinusitis (chronic)   . Hiatal hernia   . Complication of anesthesia   . PONV (postoperative nausea and vomiting)   . Cancer [facility])     prostate cancer     Social History   Social History  . Marital Status: Married    Spouse Name: Andrew Gaines  . Number of Children: 1  . Years of Education: Bachelor   Occupational History  . Sales    Social History Main Topics  . Smoking status: Never Smoker   . Smokeless tobacco: Never Used  . Alcohol Use: 4.2 oz/week    7 Cans of beer per week     Comment: rarely  . Drug Use: No  . Sexual Activity: Not on file   Other Topics Concern  . Not on file   Social History Narrative   Patient is married to [name], has 1 child   Patient is right handed   Education level is Bachelor's degree   Caffeine consumption is 2-3 cups daily          Past Surgical History  Procedure Laterality Date  . Joint replacement      bilateral knee   . Knee arthroscopy      bilateral   . Shoulder arthroscopy      bilateral   . Robot assisted laparoscopic radical prostatectomy N/A 07/20/2014    Procedure:  ROBOTIC ASSISTED LAPAROSCOPIC RADICAL PROSTATECTOMY LEVEL 2;  Surgeon: Raynelle Bring, MD;  Location: [facility];  Service: Urology;  Laterality: N/A;  . Lymphadenectomy Bilateral 07/20/2014    Procedure: LYMPHADENECTOMY;  Surgeon: Raynelle Bring, MD;  Location: [facility];  Service: Urology;  Laterality: Bilateral;    Family History  Problem Relation Age of Onset  . Breast cancer Maternal Grandmother   . Colon cancer Paternal Grandmother     No Known Allergies  Current Outpatient Prescriptions on File Prior to Visit  Medication Sig Dispense Refill  . acetaminophen (TYLENOL) 500 MG tablet Take 1,000 mg by mouth every 6 (six) hours as needed for mild pain or moderate pain.    Marland Kitchen escitalopram (LEXAPRO) 10 MG tablet Take 10 mg by mouth every morning.    Marland Kitchen losartan (COZAAR) 50 MG tablet TAKE 1 TABLET BY MOUTH EVERY MORNING 100 tablet 2  . omeprazole (PRILOSEC) 40 MG capsule TAKE 1 CAPSULE AT BEDTIME 90 capsule 0   No current facility-administered medications on file prior to visit.    BP 130/84 mmHg  Temp(Src) 98.1 F (36.7 C) (Oral)  Ht 6' (1.829 m)  Wt 255 lb 9.6 oz (115.939 kg)  BMI 34.66 kg/m2       Objective:  Physical Exam  Constitutional: He is oriented to person, place, and time. He appears well-developed and well-nourished. No distress.  Neck: Carotid bruit is not present.  Cardiovascular: Normal rate, regular rhythm, normal heart sounds and intact distal pulses.  Exam reveals no gallop and no friction rub.   No murmur heard. Pulmonary/Chest: Effort normal and breath sounds normal. No respiratory distress. He has no wheezes. He has no rales. He exhibits no tenderness.  Neurological: He is alert and oriented to person, place, and time.  Skin: Skin is warm and dry. No rash noted. He is not diaphoretic. No erythema. No pallor.  Psychiatric: He has a normal mood and affect. His behavior is normal. Judgment and thought content normal.  Nursing note and vitals reviewed.        Assessment & Plan:  1. Hyperlipidemia - He needs a physical for further refills of medications.  - atorvastatin (LIPITOR) 20 MG tablet; Take 1 tablet (20 mg total) by mouth daily.  Dispense: 90 tablet; Refill: 0 - Follow up as needed  Dorothyann Peng, NP

## 2016-02-02 ENCOUNTER — Other Ambulatory Visit: Payer: Self-pay | Admitting: Family Medicine

## 2016-02-02 NOTE — Telephone Encounter (Signed)
Rx refill sent to pharmacy. 

## 2016-03-27 DIAGNOSIS — M1812 Unilateral primary osteoarthritis of first carpometacarpal joint, left hand: Secondary | ICD-10-CM | POA: Diagnosis not present

## 2016-03-27 DIAGNOSIS — M180 Bilateral primary osteoarthritis of first carpometacarpal joints: Secondary | ICD-10-CM | POA: Diagnosis not present

## 2016-03-27 DIAGNOSIS — M1811 Unilateral primary osteoarthritis of first carpometacarpal joint, right hand: Secondary | ICD-10-CM | POA: Diagnosis not present

## 2016-03-29 DIAGNOSIS — Z0100 Encounter for examination of eyes and vision without abnormal findings: Secondary | ICD-10-CM | POA: Diagnosis not present

## 2016-03-29 DIAGNOSIS — H4720 Unspecified optic atrophy: Secondary | ICD-10-CM | POA: Diagnosis not present

## 2016-04-12 ENCOUNTER — Encounter: Payer: BLUE CROSS/BLUE SHIELD | Admitting: Adult Health

## 2016-04-18 ENCOUNTER — Other Ambulatory Visit: Payer: BLUE CROSS/BLUE SHIELD

## 2016-04-25 ENCOUNTER — Ambulatory Visit (INDEPENDENT_AMBULATORY_CARE_PROVIDER_SITE_OTHER): Payer: BLUE CROSS/BLUE SHIELD | Admitting: Adult Health

## 2016-04-25 ENCOUNTER — Encounter: Payer: Self-pay | Admitting: Adult Health

## 2016-04-25 ENCOUNTER — Encounter: Payer: Self-pay | Admitting: Gastroenterology

## 2016-04-25 VITALS — BP 142/70 | Temp 97.9°F | Ht 72.0 in | Wt 259.4 lb

## 2016-04-25 DIAGNOSIS — I10 Essential (primary) hypertension: Secondary | ICD-10-CM

## 2016-04-25 DIAGNOSIS — Z1211 Encounter for screening for malignant neoplasm of colon: Secondary | ICD-10-CM

## 2016-04-25 DIAGNOSIS — Z23 Encounter for immunization: Secondary | ICD-10-CM | POA: Diagnosis not present

## 2016-04-25 DIAGNOSIS — E785 Hyperlipidemia, unspecified: Secondary | ICD-10-CM

## 2016-04-25 DIAGNOSIS — Z0000 Encounter for general adult medical examination without abnormal findings: Secondary | ICD-10-CM | POA: Diagnosis not present

## 2016-04-25 LAB — CBC WITH DIFFERENTIAL/PLATELET
Basophils Absolute: 0.1 10*3/uL (ref 0.0–0.1)
Basophils Relative: 1.1 % (ref 0.0–3.0)
Eosinophils Absolute: 0.5 10*3/uL (ref 0.0–0.7)
Eosinophils Relative: 10.4 % — ABNORMAL HIGH (ref 0.0–5.0)
HCT: 35.9 % — ABNORMAL LOW (ref 39.0–52.0)
Hemoglobin: 12 g/dL — ABNORMAL LOW (ref 13.0–17.0)
LYMPHS ABS: 1.6 10*3/uL (ref 0.7–4.0)
Lymphocytes Relative: 35.5 % (ref 12.0–46.0)
MCHC: 33.4 g/dL (ref 30.0–36.0)
MCV: 76 fl — ABNORMAL LOW (ref 78.0–100.0)
MONO ABS: 0.5 10*3/uL (ref 0.1–1.0)
Monocytes Relative: 10.8 % (ref 3.0–12.0)
NEUTROS PCT: 42.2 % — AB (ref 43.0–77.0)
Neutro Abs: 1.9 10*3/uL (ref 1.4–7.7)
PLATELETS: 212 10*3/uL (ref 150.0–400.0)
RBC: 4.72 Mil/uL (ref 4.22–5.81)
RDW: 18.8 % — ABNORMAL HIGH (ref 11.5–15.5)
WBC: 4.5 10*3/uL (ref 4.0–10.5)

## 2016-04-25 LAB — BASIC METABOLIC PANEL
BUN: 16 mg/dL (ref 6–23)
CALCIUM: 8.7 mg/dL (ref 8.4–10.5)
CO2: 26 mEq/L (ref 19–32)
Chloride: 105 mEq/L (ref 96–112)
Creatinine, Ser: 1.06 mg/dL (ref 0.40–1.50)
GFR: 78.03 mL/min (ref >60.00)
GLUCOSE: 130 mg/dL — AB (ref 70–99)
Potassium: 3.9 mEq/L (ref 3.5–5.1)
Sodium: 138 mEq/L (ref 135–145)

## 2016-04-25 LAB — POC URINALSYSI DIPSTICK (AUTOMATED)
Bilirubin, UA: NEGATIVE
Glucose, UA: NEGATIVE
KETONES UA: NEGATIVE
Leukocytes, UA: NEGATIVE
Nitrite, UA: NEGATIVE
Protein, UA: NEGATIVE
RBC UA: NEGATIVE
Spec Grav, UA: 1.025
UROBILINOGEN UA: 0.2
pH, UA: 5.5

## 2016-04-25 LAB — LIPID PANEL
CHOLESTEROL: 151 mg/dL (ref 0–200)
HDL: 48.4 mg/dL (ref >39.00)
LDL CALC: 78 mg/dL (ref 0–99)
NonHDL: 102.16
TRIGLYCERIDES: 122 mg/dL (ref 0.0–149.0)
Total CHOL/HDL Ratio: 3
VLDL: 24.4 mg/dL (ref 0.0–40.0)

## 2016-04-25 LAB — HEPATIC FUNCTION PANEL
ALBUMIN: 4 g/dL (ref 3.5–5.2)
ALT: 41 U/L (ref 0–53)
AST: 26 U/L (ref 0–37)
Alkaline Phosphatase: 69 U/L (ref 39–117)
Bilirubin, Direct: 0.1 mg/dL (ref 0.0–0.3)
Total Bilirubin: 0.7 mg/dL (ref 0.2–1.2)
Total Protein: 6.5 g/dL (ref 6.0–8.3)

## 2016-04-25 LAB — TSH: TSH: 0.99 u[IU]/mL (ref 0.35–4.50)

## 2016-04-25 NOTE — Progress Notes (Signed)
Subjective:    Patient ID: Andrew Gaines, male    DOB: 16-Oct-1965, 50 y.o.   MRN: [id number]  HPI  Patient presents for yearly preventative medicine examination. He is a pleasant 50 year old male who  has a past medical history of Cancer ([place]); Complication of anesthesia; GERD (gastroesophageal reflux disease); Hiatal hernia; PONV (postoperative nausea and vomiting); Unspecified essential hypertension; and Unspecified sinusitis (chronic).   All immunizations and health maintenance protocols were reviewed with the patient and needed orders were placed.  Appropriate screening laboratory values were ordered for the patient including screening of hyperlipidemia, renal function and hepatic function. If indicated by BPH, a PSA was ordered.  Medication reconciliation,  past medical history, social history, problem list and allergies were reviewed in detail with the patient  Goals were established with regard to weight loss, exercise, and  diet in compliance with medications. He is walking periodically and is " trying" to watch what he eats.   He has a history of hyperlipidemia he takes Lipitor 20 mg daily  He takes Cozaar 50 mg daily for HTN. He monitors at home and states" they are normal." Took his medication prior to arrival today.   He takes omeprazole 40 mg a day bedtime for GERD  He has routine dental care. Has not had a colonoscopy since turning 48.     Review of Systems  Constitutional: Negative.   HENT: Negative.   Eyes: Negative.   Respiratory: Negative.   Cardiovascular: Negative.   Endocrine: Negative.   Genitourinary: Negative.   Musculoskeletal: Negative.   Skin: Negative.   Allergic/Immunologic: Negative.   Neurological: Negative.   Hematological: Negative.   All other systems reviewed and are negative.  Past Medical History:  Diagnosis Date  . Cancer [facility])    prostate cancer   . Complication of anesthesia   . GERD (gastroesophageal reflux disease)   .  Hiatal hernia   . PONV (postoperative nausea and vomiting)   . Unspecified essential hypertension   . Unspecified sinusitis (chronic)     Social History   Social History  . Marital status: Married    Spouse name: [name]  . Number of children: 1  . Years of education: Bachelor   Occupational History  . Sales    Social History Main Topics  . Smoking status: Never Smoker  . Smokeless tobacco: Never Used  . Alcohol use 4.2 oz/week    7 Cans of beer per week     Comment: rarely  . Drug use: No  . Sexual activity: Not on file   Other Topics Concern  . Not on file   Social History Narrative   Patient is married to [name], has 1 child   Patient is right handed   Education level is Bachelor's degree   Caffeine consumption is 2-3 cups daily          Past Surgical History:  Procedure Laterality Date  . JOINT REPLACEMENT     bilateral knee   . KNEE ARTHROSCOPY     bilateral   . LYMPHADENECTOMY Bilateral 07/20/2014   Procedure: LYMPHADENECTOMY;  Surgeon: Raynelle Bring, MD;  Location: [facility];  Service: Urology;  Laterality: Bilateral;  . ROBOT ASSISTED LAPAROSCOPIC RADICAL PROSTATECTOMY N/A 07/20/2014   Procedure: ROBOTIC ASSISTED LAPAROSCOPIC RADICAL PROSTATECTOMY LEVEL 2;  Surgeon: Raynelle Bring, MD;  Location: [facility];  Service: Urology;  Laterality: N/A;  . SHOULDER ARTHROSCOPY     bilateral     Family History  Problem Relation  Age of Onset  . Breast cancer Maternal Grandmother   . Colon cancer Paternal Grandmother     No Known Allergies  Current Outpatient Prescriptions on File Prior to Visit  Medication Sig Dispense Refill  . acetaminophen (TYLENOL) 500 MG tablet Take 1,000 mg by mouth every 6 (six) hours as needed for mild pain or moderate pain.    Marland Kitchen atorvastatin (LIPITOR) 20 MG tablet Take 1 tablet (20 mg total) by mouth daily. 90 tablet 0  . escitalopram (LEXAPRO) 10 MG tablet Take 10 mg by mouth every morning.    Marland Kitchen losartan (COZAAR) 50 MG tablet TAKE 1  TABLET BY MOUTH EVERY MORNING 100 tablet 0  . omeprazole (PRILOSEC) 40 MG capsule TAKE 1 CAPSULE AT BEDTIME 90 capsule 0  . sildenafil (REVATIO) 20 MG tablet Take 20 mg by mouth as needed.     No current facility-administered medications on file prior to visit.     There were no vitals taken for this visit.      Objective:   Physical Exam  Constitutional: He is oriented to person, place, and time. He appears well-developed and well-nourished. No distress.  HENT:  Head: Normocephalic and atraumatic.  Right Ear: External ear normal.  Left Ear: External ear normal.  Nose: Nose normal.  Mouth/Throat: Oropharynx is clear and moist. No oropharyngeal exudate.  Eyes: Conjunctivae and EOM are normal. Pupils are equal, round, and reactive to light. Right eye exhibits no discharge. Left eye exhibits no discharge. No scleral icterus.  Neck: Normal range of motion. Neck supple. No tracheal deviation present. No thyromegaly present.  Cardiovascular: Normal rate, regular rhythm, normal heart sounds and intact distal pulses.  Exam reveals no gallop and no friction rub.   No murmur heard. Pulmonary/Chest: Effort normal and breath sounds normal. No stridor. No respiratory distress. He has no wheezes. He has no rales. He exhibits no tenderness.  Abdominal: Soft. Bowel sounds are normal. He exhibits no distension and no mass. There is no tenderness. There is no rebound and no guarding.  Genitourinary:  Genitourinary Comments: Refused. Had prostate removed in 2015  Musculoskeletal: Normal range of motion. He exhibits no edema, tenderness or deformity.  Lymphadenopathy:    He has no cervical adenopathy.  Neurological: He is alert and oriented to person, place, and time. He has normal reflexes. He displays normal reflexes. No cranial nerve deficit. He exhibits normal muscle tone. Coordination normal.  Skin: Skin is warm and dry. No rash noted. He is not diaphoretic. No erythema. No pallor.  Psychiatric: He  has a normal mood and affect. His behavior is normal. Judgment and thought content normal.  Nursing note and vitals reviewed.     Assessment & Plan:  1. Routine general medical examination at a health care facility - Ambulatory referral to Gastroenterology - POCT Urinalysis Dipstick (Automated) - Basic metabolic panel - CBC with Differential/Platelet - Hepatic function panel - Lipid panel - TSH - Routine exercise and heart healthy diet.  - Follow up in one year or sooner if needed 2. Essential hypertension - Not at goal this morning. Asked to monitor blood pressure at home and informed PCP if BP at or above 140 consistently.  - Needs to exercise more and eat a heart healthy diet.  - POCT Urinalysis Dipstick (Automated) - Basic metabolic panel - CBC with Differential/Platelet - Hepatic function panel - Lipid panel - TSH  3. Colon cancer screening  - Ambulatory referral to Gastroenterology  4. Hyperlipidemia - POCT Urinalysis Dipstick (Automated) -  Basic metabolic panel - CBC with Differential/Platelet - Hepatic function panel - Lipid panel - TSH  5. Need for prophylactic vaccination and inoculation against influenza  - Flu Vaccine QUAD 36+ mos PF IM (Fluarix & Fluzone Quad PF)   Dorothyann Peng, NP

## 2016-04-25 NOTE — Patient Instructions (Addendum)
It was great seeing you again!  I will follow up with you on your blood work.   Please work on weight loss through a heart healthy diet and increasing your aerobic exercise.   Follow up in one year or sooner if needed  Health Maintenance, Male A healthy lifestyle and preventative care can promote health and wellness.  Maintain regular health, dental, and eye exams.  Eat a healthy diet. Foods like vegetables, fruits, whole grains, low-fat dairy products, and lean protein foods contain the nutrients you need and are low in calories. Decrease your intake of foods high in solid fats, added sugars, and salt. Get information about a proper diet from your health care provider, if necessary.  Regular physical exercise is one of the most important things you can do for your health. Most adults should get at least 150 minutes of moderate-intensity exercise (any activity that increases your heart rate and causes you to sweat) each week. In addition, most adults need muscle-strengthening exercises on 2 or more days a week.   Maintain a healthy weight. The body mass index (BMI) is a screening tool to identify possible weight problems. It provides an estimate of body fat based on height and weight. Your health care provider can find your BMI and can help you achieve or maintain a healthy weight. For males 20 years and older:  A BMI below 18.5 is considered underweight.  A BMI of 18.5 to 24.9 is normal.  A BMI of 25 to 29.9 is considered overweight.  A BMI of 30 and above is considered obese.  Maintain normal blood lipids and cholesterol by exercising and minimizing your intake of saturated fat. Eat a balanced diet with plenty of fruits and vegetables. Blood tests for lipids and cholesterol should begin at age 61 and be repeated every 5 years. If your lipid or cholesterol levels are high, you are over age 43, or you are at high risk for heart disease, you may need your cholesterol levels checked more  frequently.Ongoing high lipid and cholesterol levels should be treated with medicines if diet and exercise are not working.  If you smoke, find out from your health care provider how to quit. If you do not use tobacco, do not start.  Lung cancer screening is recommended for adults aged 81-80 years who are at high risk for developing lung cancer because of a history of smoking. A yearly low-dose CT scan of the lungs is recommended for people who have at least a 30-pack-year history of smoking and are current smokers or have quit within the past 15 years. A pack year of smoking is smoking an average of 1 pack of cigarettes a day for 1 year (for example, a 30-pack-year history of smoking could mean smoking 1 pack a day for 30 years or 2 packs a day for 15 years). Yearly screening should continue until the smoker has stopped smoking for at least 15 years. Yearly screening should be stopped for people who develop a health problem that would prevent them from having lung cancer treatment.  If you choose to drink alcohol, do not have more than 2 drinks per day. One drink is considered to be 12 oz (360 mL) of beer, 5 oz (150 mL) of wine, or 1.5 oz (45 mL) of liquor.  Avoid the use of street drugs. Do not share needles with anyone. Ask for help if you need support or instructions about stopping the use of drugs.  High blood pressure causes heart  disease and increases the risk of stroke. High blood pressure is more likely to develop in:  People who have blood pressure in the end of the normal range (100-139/85-89 mm Hg).  People who are overweight or obese.  People who are African American.  If you are 69-14 years of age, have your blood pressure checked every 3-5 years. If you are 63 years of age or older, have your blood pressure checked every year. You should have your blood pressure measured twice--once when you are at a hospital or clinic, and once when you are not at a hospital or clinic. Record the  average of the two measurements. To check your blood pressure when you are not at a hospital or clinic, you can use:  An automated blood pressure machine at a pharmacy.  A home blood pressure monitor.  If you are 62-68 years old, ask your health care provider if you should take aspirin to prevent heart disease.  Diabetes screening involves taking a blood sample to check your fasting blood sugar level. This should be done once every 3 years after age 51 if you are at a normal weight and without risk factors for diabetes. Testing should be considered at a younger age or be carried out more frequently if you are overweight and have at least 1 risk factor for diabetes.  Colorectal cancer can be detected and often prevented. Most routine colorectal cancer screening begins at the age of 73 and continues through age 11. However, your health care provider may recommend screening at an earlier age if you have risk factors for colon cancer. On a yearly basis, your health care provider may provide home test kits to check for hidden blood in the stool. A small camera at the end of a tube may be used to directly examine the colon (sigmoidoscopy or colonoscopy) to detect the earliest forms of colorectal cancer. Talk to your health care provider about this at age 58 when routine screening begins. A direct exam of the colon should be repeated every 5-10 years through age 77, unless early forms of precancerous polyps or small growths are found.  People who are at an increased risk for hepatitis B should be screened for this virus. You are considered at high risk for hepatitis B if:  You were born in a country where hepatitis B occurs often. Talk with your health care provider about which countries are considered high risk.  Your parents were born in a high-risk country and you have not received a shot to protect against hepatitis B (hepatitis B vaccine).  You have HIV or AIDS.  You use needles to inject street  drugs.  You live with, or have sex with, someone who has hepatitis B.  You are a man who has sex with other men (MSM).  You get hemodialysis treatment.  You take certain medicines for conditions like cancer, organ transplantation, and autoimmune conditions.  Hepatitis C blood testing is recommended for all people born from [birth year] through 1967 and any individual with known risk factors for hepatitis C.  Healthy men should no longer receive prostate-specific antigen (PSA) blood tests as part of routine cancer screening. Talk to your health care provider about prostate cancer screening.  Testicular cancer screening is not recommended for adolescents or adult males who have no symptoms. Screening includes self-exam, a health care provider exam, and other screening tests. Consult with your health care provider about any symptoms you have or any concerns you have about testicular  cancer.  Practice safe sex. Use condoms and avoid high-risk sexual practices to reduce the spread of sexually transmitted infections (STIs).  You should be screened for STIs, including gonorrhea and chlamydia if:  You are sexually active and are younger than 24 years.  You are older than 24 years, and your health care provider tells you that you are at risk for this type of infection.  Your sexual activity has changed since you were last screened, and you are at an increased risk for chlamydia or gonorrhea. Ask your health care provider if you are at risk.  If you are at risk of being infected with HIV, it is recommended that you take a prescription medicine daily to prevent HIV infection. This is called pre-exposure prophylaxis (PrEP). You are considered at risk if:  You are a man who has sex with other men (MSM).  You are a heterosexual man who is sexually active with multiple partners.  You take drugs by injection.  You are sexually active with a partner who has HIV.  Talk with your health care provider about  whether you are at high risk of being infected with HIV. If you choose to begin PrEP, you should first be tested for HIV. You should then be tested every 3 months for as long as you are taking PrEP.  Use sunscreen. Apply sunscreen liberally and repeatedly throughout the day. You should seek shade when your shadow is shorter than you. Protect yourself by wearing long sleeves, pants, a wide-brimmed hat, and sunglasses year round whenever you are outdoors.  Tell your health care provider of new moles or changes in moles, especially if there is a change in shape or color. Also, tell your health care provider if a mole is larger than the size of a pencil eraser.  A one-time screening for abdominal aortic aneurysm (AAA) and surgical repair of large AAAs by ultrasound is recommended for men aged 93-75 years who are current or former smokers.  Stay current with your vaccines (immunizations).   This information is not intended to replace advice given to you by your health care provider. Make sure you discuss any questions you have with your health care provider.   Document Released: 02/03/2008 Document Revised: 08/28/2014 Document Reviewed: 01/02/2011 Elsevier Interactive Patient Education [employer].

## 2016-04-26 ENCOUNTER — Telehealth: Payer: Self-pay | Admitting: Adult Health

## 2016-04-26 ENCOUNTER — Other Ambulatory Visit (INDEPENDENT_AMBULATORY_CARE_PROVIDER_SITE_OTHER): Payer: BLUE CROSS/BLUE SHIELD

## 2016-04-26 DIAGNOSIS — E119 Type 2 diabetes mellitus without complications: Secondary | ICD-10-CM | POA: Diagnosis not present

## 2016-04-26 LAB — HEMOGLOBIN A1C: Hgb A1c MFr Bld: 7.4 % — ABNORMAL HIGH (ref 4.6–6.5)

## 2016-04-26 MED ORDER — METFORMIN HCL 500 MG PO TABS: 500.0000 mg | ORAL_TABLET | Freq: Two times a day (BID) | ORAL | 3 refills | Status: DC

## 2016-04-26 NOTE — Telephone Encounter (Signed)
Spoke to Andrew Gaines and updated him on his labs. His A1C was 7.4. I am going to start him on Metformin 500mg  BID. He was advised weight loss through a diabetic diet and exercise.   Also advised to research and follow up in 3 months.

## 2016-04-28 ENCOUNTER — Other Ambulatory Visit: Payer: Self-pay | Admitting: Family Medicine

## 2016-05-11 DIAGNOSIS — R0683 Snoring: Secondary | ICD-10-CM | POA: Diagnosis not present

## 2016-05-30 ENCOUNTER — Ambulatory Visit (AMBULATORY_SURGERY_CENTER): Payer: Self-pay | Admitting: *Deleted

## 2016-05-30 VITALS — Ht 72.0 in | Wt 254.0 lb

## 2016-05-30 DIAGNOSIS — C61 Malignant neoplasm of prostate: Secondary | ICD-10-CM | POA: Diagnosis not present

## 2016-05-30 DIAGNOSIS — Z1211 Encounter for screening for malignant neoplasm of colon: Secondary | ICD-10-CM

## 2016-05-30 MED ORDER — NA SULFATE-K SULFATE-MG SULF 17.5-3.13-1.6 GM/177ML PO SOLN: ORAL | 0 refills | Status: DC

## 2016-05-30 NOTE — Progress Notes (Signed)
Patient denies any allergies to eggs or soy. Patient denies any problems with anesthesia/sedation. Patient denies any oxygen use at home and does not take any diet/weight loss medications. Patient declined EMMI education. 

## 2016-06-01 DIAGNOSIS — G4733 Obstructive sleep apnea (adult) (pediatric): Secondary | ICD-10-CM | POA: Diagnosis not present

## 2016-06-16 ENCOUNTER — Encounter: Payer: Self-pay | Admitting: Gastroenterology

## 2016-06-24 ENCOUNTER — Other Ambulatory Visit: Payer: Self-pay | Admitting: Adult Health

## 2016-06-24 DIAGNOSIS — E785 Hyperlipidemia, unspecified: Secondary | ICD-10-CM

## 2016-06-26 ENCOUNTER — Encounter: Payer: BLUE CROSS/BLUE SHIELD | Admitting: Gastroenterology

## 2016-06-26 ENCOUNTER — Telehealth: Payer: Self-pay | Admitting: Family Medicine

## 2016-06-26 DIAGNOSIS — N5201 Erectile dysfunction due to arterial insufficiency: Secondary | ICD-10-CM | POA: Diagnosis not present

## 2016-06-26 DIAGNOSIS — C61 Malignant neoplasm of prostate: Secondary | ICD-10-CM | POA: Diagnosis not present

## 2016-06-26 NOTE — Telephone Encounter (Signed)
Pt had cpe with Tommi Rumps on 04/25/16 Pt was only sent in a 30 day only and advised he needs a cpe.  Can you send in a 90 day with [facility]  Pt states he is still a pt of Dr Sherren Mocha.  Just saw Tommi Rumps because Dr Sherren Mocha did not have any available cpe appt. (?)

## 2016-06-26 NOTE — Telephone Encounter (Signed)
Send a 90 day supply of what?

## 2016-06-27 ENCOUNTER — Other Ambulatory Visit: Payer: Self-pay | Admitting: Emergency Medicine

## 2016-06-27 MED ORDER — ATORVASTATIN CALCIUM 20 MG PO TABS: 20.0000 mg | ORAL_TABLET | Freq: Every day | ORAL | 0 refills | Status: DC

## 2016-06-27 NOTE — Telephone Encounter (Signed)
90 day supply send to pharmacy.

## 2016-06-27 NOTE — Telephone Encounter (Signed)
Oops!! atorvastatin (LIPITOR) 20 MG tablet

## 2016-06-29 ENCOUNTER — Encounter: Payer: Self-pay | Admitting: Gastroenterology

## 2016-06-29 ENCOUNTER — Ambulatory Visit (AMBULATORY_SURGERY_CENTER): Payer: BLUE CROSS/BLUE SHIELD | Admitting: Gastroenterology

## 2016-06-29 VITALS — BP 106/72 | HR 62 | Temp 96.8°F | Resp 16 | Ht 72.0 in | Wt 254.0 lb

## 2016-06-29 DIAGNOSIS — Z1212 Encounter for screening for malignant neoplasm of rectum: Secondary | ICD-10-CM

## 2016-06-29 DIAGNOSIS — Z1211 Encounter for screening for malignant neoplasm of colon: Secondary | ICD-10-CM

## 2016-06-29 LAB — GLUCOSE, CAPILLARY
Glucose-Capillary: 117 mg/dL — ABNORMAL HIGH (ref 65–99)
Glucose-Capillary: 117 mg/dL — ABNORMAL HIGH (ref 65–99)

## 2016-06-29 MED ORDER — SODIUM CHLORIDE 0.9 % IV SOLN: 500.0000 mL | INTRAVENOUS | Status: AC

## 2016-06-29 NOTE — Progress Notes (Signed)
A/ox3 pleased with MAC, report to Robbin RN 

## 2016-06-29 NOTE — Op Note (Signed)
[place] Patient Name: Andrew Gaines Procedure Date: 06/29/2016 7:57 AM MRN: [id number] Endoscopist: Remo Lipps P. Vesper Trant MD, MD Age: 50 Referring MD:  Date of Birth: [birth year] Gender: Male Account #: [id number] Procedure:                Colonoscopy Indications:              Screening for malignant neoplasm in the colon, This                            is the patient's first colonoscopy Medicines:                Monitored Anesthesia Care Procedure:                Pre-Anesthesia Assessment:                           - Prior to the procedure, a History and Physical                            was performed, and patient medications and                            allergies were reviewed. The patient's tolerance of                            previous anesthesia was also reviewed. The risks                            and benefits of the procedure and the sedation                            options and risks were discussed with the patient.                            All questions were answered, and informed consent                            was obtained. Prior Anticoagulants: The patient has                            taken aspirin, last dose was 1 day prior to                            procedure. ASA Grade Assessment: II - A patient                            with mild systemic disease. After reviewing the                            risks and benefits, the patient was deemed in                            satisfactory condition to undergo the procedure.  After obtaining informed consent, the colonoscope                            was passed under direct vision. Throughout the                            procedure, the patient's blood pressure, pulse, and                            oxygen saturations were monitored continuously. The                            Model CF-HQ190L [id number]) scope was introduced                            through the anus  and advanced to the the cecum,                            identified by appendiceal orifice and ileocecal                            valve. The colonoscopy was performed without                            difficulty. The patient tolerated the procedure                            well. The quality of the bowel preparation was                            adequate. The ileocecal valve, appendiceal orifice,                            and rectum were photographed. Scope In: 8:03:13 AM Scope Out: 8:25:19 AM Scope Withdrawal Time: 0 hours 17 minutes 42 seconds  Total Procedure Duration: 0 hours 22 minutes 6 seconds  Findings:                 The perianal and digital rectal examinations were                            normal.                           Many medium-mouthed diverticula were found in the                            left colon.                           A large amount of liquid stool was found in the                            entire colon. Lavage of the area was performed  using copious amounts of fluid, resulting in                            clearance with adequate visualization.                           Non-bleeding internal hemorrhoids were found during                            retroflexion.                           The exam was otherwise without abnormality. Complications:            No immediate complications. Estimated blood loss:                            None. Estimated Blood Loss:     Estimated blood loss: none. Impression:               - Diverticulosis in the left colon.                           - Stool in the entire examined colon.                           - Non-bleeding internal hemorrhoids.                           - The examination was otherwise normal. No polyps Recommendation:           - Patient has a contact number available for                            emergencies. The signs and symptoms of potential                             delayed complications were discussed with the                            patient. Return to normal activities tomorrow.                            Written discharge instructions were provided to the                            patient.                           - Resume previous diet.                           - Continue present medications.                           - Repeat colonoscopy in 10 years for screening  purposes. Remo Lipps P. Thayer Inabinet MD, MD 06/29/2016 8:29:11 AM This report has been signed electronically.

## 2016-06-29 NOTE — Patient Instructions (Signed)
Diverticulosis and hemorrhoids seen today. Handouts given on diverticulosis,hemrrhoids.  Repeat colonoscopy in 10 years. Resume current medications. Call us with any questions or concerns. Thank you!  YOU HAD AN ENDOSCOPIC PROCEDURE TODAY AT [facility]:   Refer to the procedure report that was given to you for any specific questions about what was found during the examination.  If the procedure report does not answer your questions, please call your gastroenterologist to clarify.  If you requested that your care partner not be given the details of your procedure findings, then the procedure report has been included in a sealed envelope for you to review at your convenience later.  YOU SHOULD EXPECT: Some feelings of bloating in the abdomen. Passage of more gas than usual.  Walking can help get rid of the air that was put into your GI tract during the procedure and reduce the bloating. If you had a lower endoscopy (such as a colonoscopy or flexible sigmoidoscopy) you may notice spotting of blood in your stool or on the toilet paper. If you underwent a bowel prep for your procedure, you may not have a normal bowel movement for a few days.  Please Note:  You might notice some irritation and congestion in your nose or some drainage.  This is from the oxygen used during your procedure.  There is no need for concern and it should clear up in a day or so.  SYMPTOMS TO REPORT IMMEDIATELY:   Following lower endoscopy (colonoscopy or flexible sigmoidoscopy):  Excessive amounts of blood in the stool  Significant tenderness or worsening of abdominal pains  Swelling of the abdomen that is new, acute  Fever of 100F or higher    For urgent or emergent issues, a gastroenterologist can be reached at any hour by calling [phone].   DIET:  We do recommend a small meal at first, but then you may proceed to your regular diet.  Drink plenty of fluids but you should avoid alcoholic beverages  for 24 hours.  ACTIVITY:  You should plan to take it easy for the rest of today and you should NOT DRIVE or use heavy machinery until tomorrow (because of the sedation medicines used during the test).    FOLLOW UP: Our staff will call the number listed on your records the next business day following your procedure to check on you and address any questions or concerns that you may have regarding the information given to you following your procedure. If we do not reach you, we will leave a message.  However, if you are feeling well and you are not experiencing any problems, there is no need to return our call.  We will assume that you have returned to your regular daily activities without incident.  If any biopsies were taken you will be contacted by phone or by letter within the next 1-3 weeks.  Please call us at [phone] if you have not heard about the biopsies in 3 weeks.    SIGNATURES/CONFIDENTIALITY: You and/or your care partner have signed paperwork which will be entered into your electronic medical record.  These signatures attest to the fact that that the information above on your After Visit Summary has been reviewed and is understood.  Full responsibility of the confidentiality of this discharge information lies with you and/or your care-partner.

## 2016-06-30 ENCOUNTER — Telehealth: Payer: Self-pay | Admitting: *Deleted

## 2016-06-30 ENCOUNTER — Other Ambulatory Visit: Payer: Self-pay | Admitting: Family Medicine

## 2016-06-30 ENCOUNTER — Telehealth: Payer: Self-pay

## 2016-06-30 NOTE — Telephone Encounter (Signed)
  Follow up Call-  Call back number 06/29/2016  Post procedure Call Back phone  # [phone]  Permission to leave phone message Yes  Some recent data might be hidden     Patient questions:  Do you have a fever, pain , or abdominal swelling? No. Pain Score  0 *  Have you tolerated food without any problems? Yes.    Have you been able to return to your normal activities? Yes.    Do you have any questions about your discharge instructions: Diet   No. Medications  No. Follow up visit  No.  Do you have questions or concerns about your Care? No.  Actions: * If pain score is 4 or above: No action needed, pain <4.

## 2016-06-30 NOTE — Telephone Encounter (Signed)
No answer left message will attempt call back later today. [facility]

## 2016-07-01 DIAGNOSIS — G4733 Obstructive sleep apnea (adult) (pediatric): Secondary | ICD-10-CM | POA: Diagnosis not present

## 2016-07-01 DIAGNOSIS — L03119 Cellulitis of unspecified part of limb: Secondary | ICD-10-CM | POA: Diagnosis not present

## 2016-07-26 DIAGNOSIS — M180 Bilateral primary osteoarthritis of first carpometacarpal joints: Secondary | ICD-10-CM | POA: Diagnosis not present

## 2016-07-31 DIAGNOSIS — G4733 Obstructive sleep apnea (adult) (pediatric): Secondary | ICD-10-CM | POA: Diagnosis not present

## 2016-08-02 ENCOUNTER — Other Ambulatory Visit: Payer: Self-pay | Admitting: Family Medicine

## 2016-08-07 DIAGNOSIS — L03119 Cellulitis of unspecified part of limb: Secondary | ICD-10-CM | POA: Diagnosis not present

## 2016-08-07 DIAGNOSIS — G4733 Obstructive sleep apnea (adult) (pediatric): Secondary | ICD-10-CM | POA: Diagnosis not present

## 2016-08-20 ENCOUNTER — Other Ambulatory Visit: Payer: Self-pay | Admitting: Adult Health

## 2016-08-22 NOTE — Telephone Encounter (Signed)
Patient has not yet established with you. No upcoming appt. Is this ok to refill?

## 2016-08-22 NOTE — Telephone Encounter (Signed)
He can have thirty days. Please remind patient he is due for diabetes follow up

## 2016-09-07 DIAGNOSIS — G4733 Obstructive sleep apnea (adult) (pediatric): Secondary | ICD-10-CM | POA: Diagnosis not present

## 2016-09-14 DIAGNOSIS — G4733 Obstructive sleep apnea (adult) (pediatric): Secondary | ICD-10-CM | POA: Diagnosis not present

## 2016-09-14 DIAGNOSIS — J310 Chronic rhinitis: Secondary | ICD-10-CM | POA: Diagnosis not present

## 2016-09-26 ENCOUNTER — Other Ambulatory Visit: Payer: Self-pay | Admitting: Adult Health

## 2016-09-26 NOTE — Telephone Encounter (Signed)
He is newly diagnosed diabetic. He should follow up with Dr. Sherren Mocha.

## 2016-09-26 NOTE — Telephone Encounter (Signed)
Ok to refill 

## 2016-10-04 ENCOUNTER — Telehealth: Payer: Self-pay | Admitting: Emergency Medicine

## 2016-10-04 MED ORDER — METFORMIN HCL 500 MG PO TABS: 500.0000 mg | ORAL_TABLET | Freq: Two times a day (BID) | ORAL | 0 refills | Status: DC

## 2016-10-04 NOTE — Telephone Encounter (Signed)
He is going to have to come in every three months for awhile to make sure his diabetes is under control. We need to make sure that the medication is working effectively to lower his blood sugars.   Is not treated appropriately diabetes can lead to heart disease, stroke, kidney damage, and nerve damage among other things.  Since he is not my patient he needs to follow up with his primary care provider so that they can continue to monitor him and write for his medications

## 2016-10-04 NOTE — Telephone Encounter (Signed)
Spoke with pt and told him per Dr. Sherren Mocha send in a three month supply for Metformin and to establish care with [facility] because Dr. Sherren Mocha is retiring soon. Pt understood and stated that he would establish care.

## 2016-10-04 NOTE — Telephone Encounter (Signed)
Spoke with pt to inform him that per Dr. Sherren Mocha send in a 3 months supply for metformin and also dr. Sherren Mocha recommended that since he is retiring that pt should establish care with a provider. Pt said he would establish care with [facility] and f/u in 3 months.

## 2016-10-04 NOTE — Telephone Encounter (Signed)
Pt states he does not understand why he needs to come in since he was just here in Sep 2017. Advised pt he has not seen Dr Sherren Mocha since 2015, and Dr Sherren Mocha needs to be up to date on his newly diagnosed DM.  Unless pt would like to move forward and transfer care, but still would need to be seen.  Pt declined appt at this time until I could clarify why he needs an appt since he was just in Sept.  Pt has been trying to get this rx filled over a week and has not heard anything.

## 2016-10-08 DIAGNOSIS — G4733 Obstructive sleep apnea (adult) (pediatric): Secondary | ICD-10-CM | POA: Diagnosis not present

## 2016-10-17 ENCOUNTER — Other Ambulatory Visit: Payer: Self-pay | Admitting: Family Medicine

## 2016-10-19 NOTE — Telephone Encounter (Signed)
Sent to the pharmacy by e-scribe for 6 months.  Pt had cpx with Tommi Rumps on 04/25/16 w/ labs.

## 2016-10-26 ENCOUNTER — Other Ambulatory Visit: Payer: Self-pay | Admitting: Family Medicine

## 2016-10-27 NOTE — Telephone Encounter (Signed)
Sent to the pharmacy by e-scribe. Pt due for cpx 04/2017.

## 2016-10-29 DIAGNOSIS — G4733 Obstructive sleep apnea (adult) (pediatric): Secondary | ICD-10-CM | POA: Diagnosis not present

## 2016-11-13 DIAGNOSIS — G4733 Obstructive sleep apnea (adult) (pediatric): Secondary | ICD-10-CM | POA: Diagnosis not present

## 2016-11-13 DIAGNOSIS — L03119 Cellulitis of unspecified part of limb: Secondary | ICD-10-CM | POA: Diagnosis not present

## 2016-11-29 DIAGNOSIS — G4733 Obstructive sleep apnea (adult) (pediatric): Secondary | ICD-10-CM | POA: Diagnosis not present

## 2016-11-30 ENCOUNTER — Other Ambulatory Visit: Payer: Self-pay | Admitting: Family Medicine

## 2016-12-11 DIAGNOSIS — M1812 Unilateral primary osteoarthritis of first carpometacarpal joint, left hand: Secondary | ICD-10-CM | POA: Diagnosis not present

## 2016-12-11 DIAGNOSIS — M1811 Unilateral primary osteoarthritis of first carpometacarpal joint, right hand: Secondary | ICD-10-CM | POA: Diagnosis not present

## 2016-12-19 ENCOUNTER — Other Ambulatory Visit: Payer: Self-pay | Admitting: Family Medicine

## 2016-12-19 NOTE — Telephone Encounter (Signed)
Sent to the pharmacy by e-scribe for 90 days.  Pt due for cpx 04/2017.

## 2016-12-29 DIAGNOSIS — G4733 Obstructive sleep apnea (adult) (pediatric): Secondary | ICD-10-CM | POA: Diagnosis not present

## 2016-12-29 DIAGNOSIS — C61 Malignant neoplasm of prostate: Secondary | ICD-10-CM | POA: Diagnosis not present

## 2017-01-01 DIAGNOSIS — F411 Generalized anxiety disorder: Secondary | ICD-10-CM | POA: Diagnosis not present

## 2017-01-30 ENCOUNTER — Other Ambulatory Visit: Payer: Self-pay | Admitting: Family Medicine

## 2017-02-01 ENCOUNTER — Other Ambulatory Visit: Payer: Self-pay | Admitting: Family Medicine

## 2017-02-01 NOTE — Telephone Encounter (Signed)
You seen pt last.  Abnormal A1C.  However, you said come back in 1 year.  Should he come in for follow up diabetes?

## 2017-02-01 NOTE — Telephone Encounter (Signed)
Per Dr. Sherren Mocha last note. Establish care with me or another provider.

## 2017-02-02 NOTE — Telephone Encounter (Signed)
Sent to the pharmacy by e-scribe. 

## 2017-02-16 DIAGNOSIS — L03119 Cellulitis of unspecified part of limb: Secondary | ICD-10-CM | POA: Diagnosis not present

## 2017-02-16 DIAGNOSIS — G4733 Obstructive sleep apnea (adult) (pediatric): Secondary | ICD-10-CM | POA: Diagnosis not present

## 2017-04-03 DIAGNOSIS — H4720 Unspecified optic atrophy: Secondary | ICD-10-CM | POA: Diagnosis not present

## 2017-04-03 DIAGNOSIS — E119 Type 2 diabetes mellitus without complications: Secondary | ICD-10-CM | POA: Diagnosis not present

## 2017-04-03 DIAGNOSIS — H5213 Myopia, bilateral: Secondary | ICD-10-CM | POA: Diagnosis not present

## 2017-04-03 LAB — HM DIABETES EYE EXAM

## 2017-04-07 ENCOUNTER — Other Ambulatory Visit: Payer: Self-pay | Admitting: Family Medicine

## 2017-04-12 ENCOUNTER — Encounter: Payer: Self-pay | Admitting: Family Medicine

## 2017-04-18 DIAGNOSIS — M180 Bilateral primary osteoarthritis of first carpometacarpal joints: Secondary | ICD-10-CM | POA: Diagnosis not present

## 2017-04-18 DIAGNOSIS — G5621 Lesion of ulnar nerve, right upper limb: Secondary | ICD-10-CM | POA: Diagnosis not present

## 2017-04-19 DIAGNOSIS — R6882 Decreased libido: Secondary | ICD-10-CM | POA: Diagnosis not present

## 2017-04-19 DIAGNOSIS — C61 Malignant neoplasm of prostate: Secondary | ICD-10-CM | POA: Diagnosis not present

## 2017-04-19 DIAGNOSIS — N5201 Erectile dysfunction due to arterial insufficiency: Secondary | ICD-10-CM | POA: Diagnosis not present

## 2017-04-23 ENCOUNTER — Other Ambulatory Visit: Payer: Self-pay | Admitting: Family Medicine

## 2017-05-22 DIAGNOSIS — G4733 Obstructive sleep apnea (adult) (pediatric): Secondary | ICD-10-CM | POA: Diagnosis not present

## 2017-05-22 DIAGNOSIS — L03119 Cellulitis of unspecified part of limb: Secondary | ICD-10-CM | POA: Diagnosis not present

## 2017-05-29 ENCOUNTER — Other Ambulatory Visit: Payer: Self-pay | Admitting: Family Medicine

## 2017-05-29 NOTE — Telephone Encounter (Signed)
Pt hasn't been seen in over a year and no future appts. scheduled please advise

## 2017-07-05 ENCOUNTER — Other Ambulatory Visit: Payer: Self-pay | Admitting: Family Medicine

## 2017-07-05 NOTE — Telephone Encounter (Signed)
Pt needs an appointment for more refills 

## 2017-07-11 DIAGNOSIS — M180 Bilateral primary osteoarthritis of first carpometacarpal joints: Secondary | ICD-10-CM | POA: Diagnosis not present

## 2017-08-01 ENCOUNTER — Other Ambulatory Visit: Payer: Self-pay | Admitting: Family Medicine

## 2017-08-09 DIAGNOSIS — M1811 Unilateral primary osteoarthritis of first carpometacarpal joint, right hand: Secondary | ICD-10-CM | POA: Diagnosis not present

## 2017-08-27 DIAGNOSIS — G4733 Obstructive sleep apnea (adult) (pediatric): Secondary | ICD-10-CM | POA: Diagnosis not present

## 2017-08-27 DIAGNOSIS — L03119 Cellulitis of unspecified part of limb: Secondary | ICD-10-CM | POA: Diagnosis not present

## 2017-09-13 DIAGNOSIS — G4733 Obstructive sleep apnea (adult) (pediatric): Secondary | ICD-10-CM | POA: Diagnosis not present

## 2017-10-01 ENCOUNTER — Other Ambulatory Visit: Payer: Self-pay | Admitting: Family Medicine

## 2017-10-29 DIAGNOSIS — M6208 Separation of muscle (nontraumatic), other site: Secondary | ICD-10-CM | POA: Diagnosis not present

## 2017-10-29 DIAGNOSIS — K432 Incisional hernia without obstruction or gangrene: Secondary | ICD-10-CM | POA: Diagnosis not present

## 2017-11-14 DIAGNOSIS — M1811 Unilateral primary osteoarthritis of first carpometacarpal joint, right hand: Secondary | ICD-10-CM | POA: Diagnosis not present

## 2017-11-21 ENCOUNTER — Ambulatory Visit: Payer: BLUE CROSS/BLUE SHIELD | Admitting: Family Medicine

## 2017-11-21 ENCOUNTER — Encounter: Payer: Self-pay | Admitting: Family Medicine

## 2017-11-21 VITALS — BP 140/82 | HR 68 | Temp 98.0°F | Wt 265.0 lb

## 2017-11-21 DIAGNOSIS — Z0000 Encounter for general adult medical examination without abnormal findings: Secondary | ICD-10-CM

## 2017-11-21 DIAGNOSIS — Z13220 Encounter for screening for lipoid disorders: Secondary | ICD-10-CM | POA: Diagnosis not present

## 2017-11-21 DIAGNOSIS — Z131 Encounter for screening for diabetes mellitus: Secondary | ICD-10-CM

## 2017-11-21 NOTE — Patient Instructions (Signed)

## 2017-11-21 NOTE — Progress Notes (Signed)
Subjective:     Andrew Gaines is a 52 y.o. male with pmh sig for prostate cancer s/p prostatectomy, HTN, OSA, HLD, GERD, DVT, DM here for a comprehensive physical exam. The patient reports no problems.  Pt initially states he is not taking any medications and has no health problems.  Per chart review: colonoscopy 06/2016 diverticula, nonbleeding internal hemorrhoids noted.  Repeat colonoscopy in 10 years.  HTN: -Patient checks BP at home typically 120-140/85 per memory -Patient states he is taking losartan 50 mg daily. -Patient endorses working out 5 times a week at [employer]. -Patient may drink 1 gallon of water per day  DM: -Patient states he is not checking FSBS at home -Unclear if pt is taking metformin 500 mg daily as he does not give a straight answer. He states he wants to have his blood work checked to see if he needs it.  Anxiety/depression: -Patient endorses taking Lexapro 10 mg -Patient states his mood, sleep, energy are fine  HLD: -Patient states he is taking atorvastatin 20 mg daily  Social History   Socioeconomic History  . Marital status: Married    Spouse name: [name]  . Number of children: 1  . Years of education: Manufacturing engineer  . Highest education level: Not on file  Occupational History  . Occupation: Press photographer  Social Needs  . Financial resource strain: Not on file  . Food insecurity:    Worry: Not on file    Inability: Not on file  . Transportation needs:    Medical: Not on file    Non-medical: Not on file  Tobacco Use  . Smoking status: Never Smoker  . Smokeless tobacco: Never Used  Substance and Sexual Activity  . Alcohol use: Yes    Comment: 2-3 times per month  . Drug use: No  . Sexual activity: Not on file  Lifestyle  . Physical activity:    Days per week: Not on file    Minutes per session: Not on file  . Stress: Not on file  Relationships  . Social connections:    Talks on phone: Not on file    Gets together: Not on file    Attends  religious service: Not on file    Active member of club or organization: Not on file    Attends meetings of clubs or organizations: Not on file    Relationship status: Not on file  . Intimate partner violence:    Fear of current or ex partner: Not on file    Emotionally abused: Not on file    Physically abused: Not on file    Forced sexual activity: Not on file  Other Topics Concern  . Not on file  Social History Narrative   Patient is married to [place], has 1 child   Patient is right handed   Education level is Bachelor's degree   Caffeine consumption is 2-3 cups daily         Health Maintenance  Topic Date Due  . Hepatitis C Screening  [birth year]  . PNEUMOCOCCAL POLYSACCHARIDE VACCINE (1) 07/04/1966  . FOOT EXAM  07/04/1974  . HIV Screening  07/05/1979  . HEMOGLOBIN A1C  10/24/2016  . INFLUENZA VACCINE  03/21/2018  . OPHTHALMOLOGY EXAM  04/03/2018  . TETANUS/TDAP  03/02/2024  . COLONOSCOPY  06/29/2026    The following portions of the patient's history were reviewed and updated as appropriate: allergies, current medications, past family history, past medical history, past social history, past surgical history and problem  list.  Review of Systems A comprehensive review of systems was negative.   Objective:    BP 140/82 (BP Location: Left Arm, Patient Position: Sitting, Cuff Size: Normal)   Pulse 68   Temp 98 F (36.7 C) (Oral)   Wt 265 lb (120.2 kg)   SpO2 96%   BMI 35.94 kg/m  General appearance: alert, cooperative and no distress Head: Normocephalic, without obvious abnormality, atraumatic Eyes: conjunctivae/corneas clear. PERRL, EOM's intact. Fundi benign. Ears: normal TM's and external ear canals both ears Nose: Nares normal. Septum midline. Mucosa normal. No drainage or sinus tenderness. Throat: lips, mucosa, and tongue normal; teeth and gums normal mild L carotid bruit Lungs: clear to auscultation bilaterally Heart: regular rate and rhythm, S1, S2 normal,  no murmur, click, rub or gallop Abdomen: soft, non-tender; bowel sounds normal; no masses,  no organomegaly Male genitalia: deferred Extremities: extremities normal, atraumatic, no cyanosis or edema Skin: Skin color, texture, turgor normal. No rashes or lesions Neurologic: Alert and oriented X 3, normal strength and tone. Normal symmetric reflexes. Normal coordination and gait    Assessment:    Healthy male exam.      Plan:      -Anticipatory guidance given including wearing seatbelts, smoke detectors in the home, increasing physical activity, increasing p.o. intake of vegetables and water -Labs ordered however patient will return to clinic fasting for cholesterol, CBC, BMP, hemoglobin A1c. -pt encouraged to continue checking bp and keep a log to bring to clinic. -Next CPE in 1 year -Given handout See After Visit Summary for Counseling Recommendations    Based on lab results will decide if adjustments need to be made for DM management and bp.  At next OFV patient needs foot exam  Grier Mitts, MD

## 2017-11-22 LAB — CBC WITH DIFFERENTIAL/PLATELET
BASOS PCT: 3.1 % — AB (ref 0.0–3.0)
Basophils Absolute: 0.1 10*3/uL (ref 0.0–0.1)
EOS ABS: 0.4 10*3/uL (ref 0.0–0.7)
Eosinophils Relative: 9 % — ABNORMAL HIGH (ref 0.0–5.0)
HEMATOCRIT: 36.4 % — AB (ref 39.0–52.0)
Hemoglobin: 12 g/dL — ABNORMAL LOW (ref 13.0–17.0)
LYMPHS ABS: 1.3 10*3/uL (ref 0.7–4.0)
Lymphocytes Relative: 28.9 % (ref 12.0–46.0)
MCHC: 32.9 g/dL (ref 30.0–36.0)
MCV: 76.1 fl — ABNORMAL LOW (ref 78.0–100.0)
MONO ABS: 0.5 10*3/uL (ref 0.1–1.0)
Monocytes Relative: 11.2 % (ref 3.0–12.0)
NEUTROS ABS: 2.2 10*3/uL (ref 1.4–7.7)
NEUTROS PCT: 47.8 % (ref 43.0–77.0)
PLATELETS: 218 10*3/uL (ref 150.0–400.0)
RBC: 4.78 Mil/uL (ref 4.22–5.81)
RDW: 16.8 % — AB (ref 11.5–15.5)
WBC: 4.6 10*3/uL (ref 4.0–10.5)

## 2017-11-22 LAB — LIPID PANEL
CHOL/HDL RATIO: 3
CHOLESTEROL: 151 mg/dL (ref 0–200)
HDL: 53 mg/dL (ref >39.00)
LDL Cholesterol: 76 mg/dL (ref 0–99)
NonHDL: 97.92
TRIGLYCERIDES: 108 mg/dL (ref 0.0–149.0)
VLDL: 21.6 mg/dL (ref 0.0–40.0)

## 2017-11-22 LAB — BASIC METABOLIC PANEL
BUN: 16 mg/dL (ref 6–23)
CHLORIDE: 104 meq/L (ref 96–112)
CO2: 28 meq/L (ref 19–32)
CREATININE: 0.97 mg/dL (ref 0.40–1.50)
Calcium: 8.9 mg/dL (ref 8.4–10.5)
GFR: 85.92 mL/min (ref >60.00)
GLUCOSE: 140 mg/dL — AB (ref 70–99)
Potassium: 4.4 mEq/L (ref 3.5–5.1)
Sodium: 138 mEq/L (ref 135–145)

## 2017-11-22 LAB — HEMOGLOBIN A1C: Hgb A1c MFr Bld: 7.3 % — ABNORMAL HIGH (ref 4.6–6.5)

## 2017-11-22 NOTE — Addendum Note (Signed)
Addended by: Tomi Likens on: 11/22/2017 08:26 AM   Modules accepted: Orders

## 2017-11-26 DIAGNOSIS — M19071 Primary osteoarthritis, right ankle and foot: Secondary | ICD-10-CM | POA: Diagnosis not present

## 2017-11-26 DIAGNOSIS — M84374A Stress fracture, right foot, initial encounter for fracture: Secondary | ICD-10-CM | POA: Diagnosis not present

## 2017-12-03 DIAGNOSIS — L03119 Cellulitis of unspecified part of limb: Secondary | ICD-10-CM | POA: Diagnosis not present

## 2017-12-03 DIAGNOSIS — M84374D Stress fracture, right foot, subsequent encounter for fracture with routine healing: Secondary | ICD-10-CM | POA: Diagnosis not present

## 2017-12-03 DIAGNOSIS — G4733 Obstructive sleep apnea (adult) (pediatric): Secondary | ICD-10-CM | POA: Diagnosis not present

## 2017-12-17 DIAGNOSIS — M84374D Stress fracture, right foot, subsequent encounter for fracture with routine healing: Secondary | ICD-10-CM | POA: Diagnosis not present

## 2017-12-26 ENCOUNTER — Other Ambulatory Visit: Payer: Self-pay | Admitting: Family Medicine

## 2018-01-21 DIAGNOSIS — F411 Generalized anxiety disorder: Secondary | ICD-10-CM | POA: Diagnosis not present

## 2018-03-28 ENCOUNTER — Other Ambulatory Visit: Payer: Self-pay | Admitting: Family Medicine

## 2018-03-28 DIAGNOSIS — M1811 Unilateral primary osteoarthritis of first carpometacarpal joint, right hand: Secondary | ICD-10-CM | POA: Diagnosis not present

## 2018-04-05 DIAGNOSIS — H4720 Unspecified optic atrophy: Secondary | ICD-10-CM | POA: Diagnosis not present

## 2018-04-05 DIAGNOSIS — H524 Presbyopia: Secondary | ICD-10-CM | POA: Diagnosis not present

## 2018-04-05 DIAGNOSIS — H5203 Hypermetropia, bilateral: Secondary | ICD-10-CM | POA: Diagnosis not present

## 2018-04-05 DIAGNOSIS — E119 Type 2 diabetes mellitus without complications: Secondary | ICD-10-CM | POA: Diagnosis not present

## 2018-04-11 ENCOUNTER — Other Ambulatory Visit: Payer: Self-pay | Admitting: Family Medicine

## 2018-04-19 DIAGNOSIS — C61 Malignant neoplasm of prostate: Secondary | ICD-10-CM | POA: Diagnosis not present

## 2018-04-19 DIAGNOSIS — R6882 Decreased libido: Secondary | ICD-10-CM | POA: Diagnosis not present

## 2018-04-24 DIAGNOSIS — N5201 Erectile dysfunction due to arterial insufficiency: Secondary | ICD-10-CM | POA: Diagnosis not present

## 2018-04-24 DIAGNOSIS — C61 Malignant neoplasm of prostate: Secondary | ICD-10-CM | POA: Diagnosis not present

## 2018-05-01 DIAGNOSIS — C61 Malignant neoplasm of prostate: Secondary | ICD-10-CM | POA: Diagnosis not present

## 2018-05-16 ENCOUNTER — Other Ambulatory Visit: Payer: Self-pay | Admitting: Family Medicine

## 2018-05-30 DIAGNOSIS — G4733 Obstructive sleep apnea (adult) (pediatric): Secondary | ICD-10-CM | POA: Diagnosis not present

## 2018-05-30 DIAGNOSIS — L03119 Cellulitis of unspecified part of limb: Secondary | ICD-10-CM | POA: Diagnosis not present

## 2018-06-03 ENCOUNTER — Other Ambulatory Visit: Payer: Self-pay | Admitting: Family Medicine

## 2018-06-22 ENCOUNTER — Other Ambulatory Visit: Payer: Self-pay | Admitting: Family Medicine

## 2018-06-28 DIAGNOSIS — M1811 Unilateral primary osteoarthritis of first carpometacarpal joint, right hand: Secondary | ICD-10-CM | POA: Diagnosis not present

## 2018-06-28 DIAGNOSIS — M24841 Other specific joint derangements of right hand, not elsewhere classified: Secondary | ICD-10-CM | POA: Diagnosis not present

## 2018-06-28 DIAGNOSIS — M19041 Primary osteoarthritis, right hand: Secondary | ICD-10-CM | POA: Diagnosis not present

## 2018-06-28 DIAGNOSIS — G8918 Other acute postprocedural pain: Secondary | ICD-10-CM | POA: Diagnosis not present

## 2018-07-09 DIAGNOSIS — M1811 Unilateral primary osteoarthritis of first carpometacarpal joint, right hand: Secondary | ICD-10-CM | POA: Diagnosis not present

## 2018-07-15 DIAGNOSIS — M1811 Unilateral primary osteoarthritis of first carpometacarpal joint, right hand: Secondary | ICD-10-CM | POA: Diagnosis not present

## 2018-07-25 DIAGNOSIS — M1811 Unilateral primary osteoarthritis of first carpometacarpal joint, right hand: Secondary | ICD-10-CM | POA: Diagnosis not present

## 2018-08-07 DIAGNOSIS — M1811 Unilateral primary osteoarthritis of first carpometacarpal joint, right hand: Secondary | ICD-10-CM | POA: Diagnosis not present

## 2018-09-04 ENCOUNTER — Ambulatory Visit: Payer: Self-pay

## 2018-09-04 NOTE — Telephone Encounter (Signed)
Pt with 1 day of constipation. Pt stated his last BM was last night and was not hard in consistency. Pt denies pian, blood, changes in diet, new medications. Pt has tried an enema, Miralax and "peridem". Pt stated that he was having mild left lower quadrant abdominal pain. No abdominal distension. Home care advice given and pt verbalized understanding.   Reason for Disposition . Mild constipation  Answer Assessment - Initial Assessment Questions 1. STOOL PATTERN OR FREQUENCY: "How often do you pass bowel movements (BMs)?"  (Normal range: tid to q 3 days)  "When was the last BM passed?"       1-2 times per day   Last night 2. STRAINING: "Do you have to strain to have a BM?"      yes 3. RECTAL PAIN: "Does your rectum hurt when the stool comes out?" If so, ask: "Do you have hemorrhoids? How bad is the pain?"  (Scale 1-10; or mild, moderate, severe)     No-no-mild 4. STOOL COMPOSITION: "Are the stools hard?"      no 5. BLOOD ON STOOLS: "Has there been any blood on the toilet tissue or on the surface of the BM?" If so, ask: "When was the last time?"      no 6. CHRONIC CONSTIPATION: "Is this a new problem for you?"  If no, ask: "How long have you had this problem?" (days, weeks, months)      yes 7. CHANGES IN DIET: "Have there been any recent changes in your diet?"      no 8. MEDICATIONS: "Have you been taking any new medications?"     no 9. LAXATIVES: "Have you been using any laxatives or enemas?"  If yes, ask "What, how often, and when was the last time?"     Enemas Miralax Peridem- this morning 10. CAUSE: "What do you think is causing the constipation?"        Pt doesn't know 11. OTHER SYMPTOMS: "Do you have any other symptoms?" (e.g., abdominal pain, fever, vomiting)       Abdominal pain- feels like a weight sitting him 12. PREGNANCY: "Is there any chance you are pregnant?" "When was your last menstrual period?"       n/a  Protocols used: CONSTIPATION-A-AH

## 2018-09-11 DIAGNOSIS — G4733 Obstructive sleep apnea (adult) (pediatric): Secondary | ICD-10-CM | POA: Diagnosis not present

## 2018-09-11 DIAGNOSIS — L03119 Cellulitis of unspecified part of limb: Secondary | ICD-10-CM | POA: Diagnosis not present

## 2018-09-16 DIAGNOSIS — G4733 Obstructive sleep apnea (adult) (pediatric): Secondary | ICD-10-CM | POA: Diagnosis not present

## 2018-09-18 DIAGNOSIS — M1811 Unilateral primary osteoarthritis of first carpometacarpal joint, right hand: Secondary | ICD-10-CM | POA: Diagnosis not present

## 2018-09-19 ENCOUNTER — Other Ambulatory Visit: Payer: Self-pay | Admitting: Family Medicine

## 2018-11-21 DIAGNOSIS — M1811 Unilateral primary osteoarthritis of first carpometacarpal joint, right hand: Secondary | ICD-10-CM | POA: Diagnosis not present

## 2018-12-12 DIAGNOSIS — M654 Radial styloid tenosynovitis [de Quervain]: Secondary | ICD-10-CM | POA: Diagnosis not present

## 2018-12-14 DIAGNOSIS — G4733 Obstructive sleep apnea (adult) (pediatric): Secondary | ICD-10-CM | POA: Diagnosis not present

## 2018-12-17 ENCOUNTER — Other Ambulatory Visit: Payer: Self-pay | Admitting: Family Medicine

## 2018-12-17 NOTE — Telephone Encounter (Signed)
Pt has a virtual visit with dr Volanda Napoleon for med refill on 12/18/2018 at 3 pm

## 2018-12-18 ENCOUNTER — Encounter: Payer: Self-pay | Admitting: Family Medicine

## 2018-12-18 ENCOUNTER — Ambulatory Visit (INDEPENDENT_AMBULATORY_CARE_PROVIDER_SITE_OTHER): Payer: BLUE CROSS/BLUE SHIELD | Admitting: Family Medicine

## 2018-12-18 ENCOUNTER — Other Ambulatory Visit: Payer: Self-pay

## 2018-12-18 DIAGNOSIS — K219 Gastro-esophageal reflux disease without esophagitis: Secondary | ICD-10-CM | POA: Diagnosis not present

## 2018-12-18 MED ORDER — OMEPRAZOLE 40 MG PO CPDR: DELAYED_RELEASE_CAPSULE | ORAL | 3 refills | Status: DC

## 2018-12-18 NOTE — Progress Notes (Signed)
Virtual Visit via Telephone Note  I connected with Andrew Gaines on 12/18/18 at  3:00 PM EDT by telephone and verified that I am speaking with the correct person using two identifiers.   I discussed the limitations, risks, security and privacy concerns of performing an evaluation and management service by telephone and the availability of in person appointments. I also discussed with the patient that there may be a patient responsible charge related to this service. The patient expressed understanding and agreed to proceed.  Location patient: home Location provider:  home office Participants present for the call: patient, provider Patient did not have a visit in the prior 7 days to address this/these issue(s).   History of Present Illness: Pt just request refill on prilosec.  States he is doing well overall.  States has been on the med for yrs.   Observations/Objective: Patient sounds cheerful and well on the phone. I do not appreciate any SOB. Speech and thought processing are grossly intact. Patient reported vitals:  Assessment and Plan: Gastroesophageal reflux disease, esophagitis presence not specified  - Plan: omeprazole (PRILOSEC) 40 MG capsule  Follow Up Instructions: F/u prn  I did not refer this patient for an OV in the next 24 hours for this/these issue(s).  I discussed the assessment and treatment plan with the patient. The patient was provided an opportunity to ask questions and all were answered. The patient agreed with the plan and demonstrated an understanding of the instructions.   The patient was advised to call back or seek an in-person evaluation if the symptoms worsen or if the condition fails to improve as anticipated.  I provided 9 minutes of non-face-to-face time during this encounter.   Billie Ruddy, MD

## 2019-01-20 DIAGNOSIS — F411 Generalized anxiety disorder: Secondary | ICD-10-CM | POA: Diagnosis not present

## 2019-03-28 ENCOUNTER — Other Ambulatory Visit: Payer: Self-pay | Admitting: Family Medicine

## 2019-04-23 DIAGNOSIS — C61 Malignant neoplasm of prostate: Secondary | ICD-10-CM | POA: Diagnosis not present

## 2019-04-30 DIAGNOSIS — N5201 Erectile dysfunction due to arterial insufficiency: Secondary | ICD-10-CM | POA: Diagnosis not present

## 2019-04-30 DIAGNOSIS — C61 Malignant neoplasm of prostate: Secondary | ICD-10-CM | POA: Diagnosis not present

## 2019-05-01 ENCOUNTER — Telehealth: Payer: Self-pay | Admitting: Family Medicine

## 2019-05-01 NOTE — Telephone Encounter (Signed)
RX refill metFORMIN (GLUCOPHAGE) 500 MG tablet  PHARM[facility]Y [id number] - [place], [place] [address] [phone] ([phone]

## 2019-05-05 ENCOUNTER — Other Ambulatory Visit: Payer: Self-pay

## 2019-05-05 MED ORDER — METFORMIN HCL 500 MG PO TABS: ORAL_TABLET | ORAL | 4 refills | Status: DC

## 2019-05-05 NOTE — Telephone Encounter (Signed)
Rx sent 

## 2019-05-09 DIAGNOSIS — G4733 Obstructive sleep apnea (adult) (pediatric): Secondary | ICD-10-CM | POA: Diagnosis not present

## 2019-05-19 DIAGNOSIS — M2550 Pain in unspecified joint: Secondary | ICD-10-CM | POA: Diagnosis not present

## 2019-06-04 DIAGNOSIS — E119 Type 2 diabetes mellitus without complications: Secondary | ICD-10-CM | POA: Diagnosis not present

## 2019-06-04 DIAGNOSIS — H524 Presbyopia: Secondary | ICD-10-CM | POA: Diagnosis not present

## 2019-06-18 DIAGNOSIS — G4733 Obstructive sleep apnea (adult) (pediatric): Secondary | ICD-10-CM | POA: Diagnosis not present

## 2019-07-23 DIAGNOSIS — M25522 Pain in left elbow: Secondary | ICD-10-CM | POA: Diagnosis not present

## 2019-08-18 ENCOUNTER — Other Ambulatory Visit: Payer: Self-pay

## 2019-08-18 MED ORDER — LOSARTAN POTASSIUM 50 MG PO TABS: ORAL_TABLET | ORAL | 1 refills | Status: DC

## 2019-08-25 DIAGNOSIS — M25522 Pain in left elbow: Secondary | ICD-10-CM | POA: Diagnosis not present

## 2019-08-29 ENCOUNTER — Other Ambulatory Visit: Payer: Self-pay | Admitting: Family Medicine

## 2019-09-02 DIAGNOSIS — G4733 Obstructive sleep apnea (adult) (pediatric): Secondary | ICD-10-CM | POA: Diagnosis not present

## 2019-09-15 DIAGNOSIS — G4733 Obstructive sleep apnea (adult) (pediatric): Secondary | ICD-10-CM | POA: Diagnosis not present

## 2019-12-13 ENCOUNTER — Other Ambulatory Visit: Payer: Self-pay | Admitting: Family Medicine

## 2019-12-13 DIAGNOSIS — K219 Gastro-esophageal reflux disease without esophagitis: Secondary | ICD-10-CM

## 2019-12-14 ENCOUNTER — Other Ambulatory Visit: Payer: Self-pay | Admitting: Family Medicine

## 2019-12-15 NOTE — Telephone Encounter (Signed)
Pt needs appointment for further refill 

## 2019-12-16 DIAGNOSIS — G4733 Obstructive sleep apnea (adult) (pediatric): Secondary | ICD-10-CM | POA: Diagnosis not present

## 2019-12-30 DIAGNOSIS — G5621 Lesion of ulnar nerve, right upper limb: Secondary | ICD-10-CM | POA: Diagnosis not present

## 2020-01-06 DIAGNOSIS — G5621 Lesion of ulnar nerve, right upper limb: Secondary | ICD-10-CM | POA: Diagnosis not present

## 2020-01-06 DIAGNOSIS — G5601 Carpal tunnel syndrome, right upper limb: Secondary | ICD-10-CM | POA: Diagnosis not present

## 2020-01-13 DIAGNOSIS — G5621 Lesion of ulnar nerve, right upper limb: Secondary | ICD-10-CM | POA: Diagnosis not present

## 2020-01-13 DIAGNOSIS — G5601 Carpal tunnel syndrome, right upper limb: Secondary | ICD-10-CM | POA: Diagnosis not present

## 2020-01-14 ENCOUNTER — Other Ambulatory Visit: Payer: Self-pay | Admitting: Orthopedic Surgery

## 2020-01-16 ENCOUNTER — Encounter (HOSPITAL_BASED_OUTPATIENT_CLINIC_OR_DEPARTMENT_OTHER): Payer: Self-pay | Admitting: Orthopedic Surgery

## 2020-01-22 ENCOUNTER — Other Ambulatory Visit (HOSPITAL_COMMUNITY): Payer: BC Managed Care – PPO

## 2020-01-26 ENCOUNTER — Ambulatory Visit (HOSPITAL_BASED_OUTPATIENT_CLINIC_OR_DEPARTMENT_OTHER): Admit: 2020-01-26 | Payer: BC Managed Care – PPO | Admitting: Orthopedic Surgery

## 2020-01-26 DIAGNOSIS — G8918 Other acute postprocedural pain: Secondary | ICD-10-CM | POA: Diagnosis not present

## 2020-01-26 DIAGNOSIS — G5601 Carpal tunnel syndrome, right upper limb: Secondary | ICD-10-CM | POA: Diagnosis not present

## 2020-01-26 DIAGNOSIS — G5621 Lesion of ulnar nerve, right upper limb: Secondary | ICD-10-CM | POA: Diagnosis not present

## 2020-01-26 HISTORY — DX: Type 2 diabetes mellitus without complications: E11.9

## 2020-01-26 HISTORY — DX: Acute embolism and thrombosis of unspecified deep veins of unspecified lower extremity: I82.409

## 2020-01-26 SURGERY — ANTERIOR INTEROSSEOUS NERVE DECOMPRESSION
Anesthesia: Monitor Anesthesia Care | Laterality: Right

## 2020-02-26 ENCOUNTER — Encounter: Payer: Self-pay | Admitting: Family Medicine

## 2020-02-26 ENCOUNTER — Other Ambulatory Visit: Payer: Self-pay

## 2020-02-26 ENCOUNTER — Ambulatory Visit (INDEPENDENT_AMBULATORY_CARE_PROVIDER_SITE_OTHER): Payer: BC Managed Care – PPO | Admitting: Family Medicine

## 2020-02-26 VITALS — BP 122/86 | HR 63 | Temp 97.8°F | Ht 72.0 in | Wt 233.2 lb

## 2020-02-26 DIAGNOSIS — Z1159 Encounter for screening for other viral diseases: Secondary | ICD-10-CM | POA: Diagnosis not present

## 2020-02-26 DIAGNOSIS — E785 Hyperlipidemia, unspecified: Secondary | ICD-10-CM

## 2020-02-26 DIAGNOSIS — I10 Essential (primary) hypertension: Secondary | ICD-10-CM

## 2020-02-26 DIAGNOSIS — E1169 Type 2 diabetes mellitus with other specified complication: Secondary | ICD-10-CM

## 2020-02-26 DIAGNOSIS — R42 Dizziness and giddiness: Secondary | ICD-10-CM

## 2020-02-26 DIAGNOSIS — Z0000 Encounter for general adult medical examination without abnormal findings: Secondary | ICD-10-CM

## 2020-02-26 LAB — POCT URINALYSIS DIPSTICK
Bilirubin, UA: NEGATIVE
Blood, UA: NEGATIVE
Glucose, UA: NEGATIVE
Ketones, UA: NEGATIVE
Leukocytes, UA: NEGATIVE
Nitrite, UA: NEGATIVE
Protein, UA: POSITIVE — AB
Spec Grav, UA: 1.025 (ref 1.010–1.025)
Urobilinogen, UA: 1 E.U./dL
pH, UA: 6 (ref 5.0–8.0)

## 2020-02-26 NOTE — Progress Notes (Signed)
Subjective:     Andrew Gaines is a 54 y.o. male and is here for a comprehensive physical exam.  Pt notes dizziness a couple of wks ago after moving from a sitting position to standing.  Also noticed 1st thing in the am.  Pt drinking 1 gal of water, and two 20 oz cups of coffee per day.  Pt denies CP, SOB, n/v, changes in vision.  Taking Lipitor 20 mg daily for cholesterol and ASA 81 mg.   Social History   Socioeconomic History  . Marital status: Married    Spouse name: [name]  . Number of children: 1  . Years of education: Manufacturing engineer  . Highest education level: Not on file  Occupational History  . Occupation: Sales  Tobacco Use  . Smoking status: Never Smoker  . Smokeless tobacco: Never Used  Vaping Use  . Vaping Use: Never used  Substance and Sexual Activity  . Alcohol use: Yes    Comment: 2-3 times per month  . Drug use: No  . Sexual activity: Not on file  Other Topics Concern  . Not on file  Social History Narrative   Patient is married to JAARS, has 1 child    Patient is right handed   Education level is Bachelor's degree   Caffeine consumption is 2-3 cups daily   Daughter is Equities trader in [place] Determinants of Health   Financial Resource Strain:   . Difficulty of Paying Living Expenses:   Food Insecurity:   . Worried About Charity fundraiser in the Last Year:   . Arboriculturist in the Last Year:   Transportation Needs:   . Film/video editor (Medical):   Marland Kitchen Lack of Transportation (Non-Medical):   Physical Activity:   . Days of Exercise per Week:   . Minutes of Exercise per Session:   Stress:   . Feeling of Stress :   Social Connections:   . Frequency of Communication with Friends and Family:   . Frequency of Social Gatherings with Friends and Family:   . Attends Religious Services:   . Active Member of Clubs or Organizations:   . Attends Archivist Meetings:   Marland Kitchen Marital Status:   Intimate Partner Violence:   . Fear of  Current or Ex-Partner:   . Emotionally Abused:   Marland Kitchen Physically Abused:   . Sexually Abused:    Health Maintenance  Topic Date Due  . Hepatitis C Screening  Never done  . PNEUMOCOCCAL POLYSACCHARIDE VACCINE AGE 69-64 HIGH RISK  Never done  . FOOT EXAM  Never done  . COVID-19 Vaccine (1) Never done  . HIV Screening  Never done  . OPHTHALMOLOGY EXAM  04/03/2018  . HEMOGLOBIN A1C  05/24/2018  . INFLUENZA VACCINE  03/21/2020  . TETANUS/TDAP  03/02/2024  . COLONOSCOPY  06/29/2026    The following portions of the patient's history were reviewed and updated as appropriate: allergies, current medications, past family history, past medical history, past social history, past surgical history and problem list.  Review of Systems Pertinent items noted in HPI and remainder of comprehensive ROS otherwise negative.   Objective:    BP 122/86 (BP Location: Left Arm, Patient Position: Sitting, Cuff Size: Normal)   Pulse 63   Temp 97.8 F (36.6 C) (Temporal)   Ht 6' (1.829 m)   Wt 233 lb 4 oz (105.8 kg)   SpO2 97%   BMI 31.63 kg/m  General appearance: alert, cooperative and no distress Head: Normocephalic, without obvious abnormality, atraumatic Eyes: conjunctivae/corneas clear. PERRL, EOM's intact. Fundi benign. Ears: normal TM's and external ear canals both ears Nose: Nares normal. Septum midline. Mucosa normal. No drainage or sinus tenderness. Throat: lips, mucosa, and tongue normal; teeth and gums normal Neck: no adenopathy, no carotid bruit, no JVD, supple, symmetrical, trachea midline and thyroid not enlarged, symmetric, no tenderness/mass/nodules Lungs: clear to auscultation bilaterally Heart: regular rate and rhythm, S1, S2 normal, no murmur, click, rub or gallop Abdomen: soft, non-tender; bowel sounds normal; no masses,  no organomegaly Extremities: extremities normal, atraumatic, no cyanosis or edema Pulses: 2+ and symmetric Skin: Skin color, texture, turgor normal. No rashes or  lesions Lymph nodes: Cervical, supraclavicular, and axillary nodes normal. Neurologic: Alert and oriented X 3, normal strength and tone. Normal symmetric reflexes. Normal coordination and gait    Assessment:    Healthy male exam who reports dizziness with sudden changes in position.  Orthostatics negative.     Plan:     Anticipatory guidance given including wearing seatbelts, smoke detectors in the home, increasing physical activity, increasing p.o. intake of water and vegetables. -We will obtain labs.  Patient to return to clinic later this week or early next week when labs available -Colonoscopy up-to-date -Given handout -Next CPE in 1 year See After Visit Summary for Counseling Recommendations   Essential hypertension  -controlled -Discussed lifestyle modifications -continue losartan 50 mg - Plan: Comprehensive metabolic panel  Dizziness  -Orthostatics obtained negative -Discussed the importance of hydration. -Encouraged to decrease caffeine intake -Given precautions -For continued symptoms obtain EKG and further work-up. - Plan: Orthostatic vital signs, POCT urinalysis dipstick, Comprehensive metabolic panel, CBC with Differential/Platelet, TSH, T4, Free, Hemoglobin A1c  Need for hepatitis C screening test  - Plan: Hep C Antibody  Hyperlipidemia associated with type 2 diabetes mellitus (HCC)  -Last hemoglobin A1c 7.3% on 11/23/2027 -Discussed lifestyle modifications -Continue Lipitor 20 mg and aspirin 81 mg daily -Continue Metformin 500 mg twice daily - Plan: Lipid panel  F/u prn  Grier Mitts, MD

## 2020-02-26 NOTE — Patient Instructions (Addendum)
Preventive Care 41-56 Years Old, Male Preventive care refers to lifestyle choices and visits with your health care provider that can promote health and wellness. This includes:  A yearly physical exam. This is also called an annual well check.  Regular dental and eye exams.  Immunizations.  Screening for certain conditions.  Healthy lifestyle choices, such as eating a healthy diet, getting regular exercise, not using drugs or products that contain nicotine and tobacco, and limiting alcohol use. What can I expect for my preventive care visit? Physical exam Your health care provider will check:  Height and weight. These may be used to calculate body mass index (BMI), which is a measurement that tells if you are at a healthy weight.  Heart rate and blood pressure.  Your skin for abnormal spots. Counseling Your health care provider may ask you questions about:  Alcohol, tobacco, and drug use.  Emotional well-being.  Home and relationship well-being.  Sexual activity.  Eating habits.  Work and work Statistician. What immunizations do I need?  Influenza (flu) vaccine  This is recommended every year. Tetanus, diphtheria, and pertussis (Tdap) vaccine  You may need a Td booster every 10 years. Varicella (chickenpox) vaccine  You may need this vaccine if you have not already been vaccinated. Zoster (shingles) vaccine  You may need this after age 64. Measles, mumps, and rubella (MMR) vaccine  You may need at least one dose of MMR if you were born in 1957 or later. You may also need a second dose. Pneumococcal conjugate (PCV13) vaccine  You may need this if you have certain conditions and were not previously vaccinated. Pneumococcal polysaccharide (PPSV23) vaccine  You may need one or two doses if you smoke cigarettes or if you have certain conditions. Meningococcal conjugate (MenACWY) vaccine  You may need this if you have certain conditions. Hepatitis A  vaccine  You may need this if you have certain conditions or if you travel or work in places where you may be exposed to hepatitis A. Hepatitis B vaccine  You may need this if you have certain conditions or if you travel or work in places where you may be exposed to hepatitis B. Haemophilus influenzae type b (Hib) vaccine  You may need this if you have certain risk factors. Human papillomavirus (HPV) vaccine  If recommended by your health care provider, you may need three doses over 6 months. You may receive vaccines as individual doses or as more than one vaccine together in one shot (combination vaccines). Talk with your health care provider about the risks and benefits of combination vaccines. What tests do I need? Blood tests  Lipid and cholesterol levels. These may be checked every 5 years, or more frequently if you are over 60 years old.  Hepatitis C test.  Hepatitis B test. Screening  Lung cancer screening. You may have this screening every year starting at age 43 if you have a 30-pack-year history of smoking and currently smoke or have quit within the past 15 years.  Prostate cancer screening. Recommendations will vary depending on your family history and other risks.  Colorectal cancer screening. All adults should have this screening starting at age 72 and continuing until age 2. Your health care provider may recommend screening at age 14 if you are at increased risk. You will have tests every 1-10 years, depending on your results and the type of screening test.  Diabetes screening. This is done by checking your blood sugar (glucose) after you have not eaten  for a while (fasting). You may have this done every 1-3 years.  Sexually transmitted disease (STD) testing. Follow these instructions at home: Eating and drinking  Eat a diet that includes fresh fruits and vegetables, whole grains, lean protein, and low-fat dairy products.  Take vitamin and mineral supplements as  recommended by your health care provider.  Do not drink alcohol if your health care provider tells you not to drink.  If you drink alcohol: ? Limit how much you have to 0-2 drinks a day. ? Be aware of how much alcohol is in your drink. In the [place] one drink equals one 12 oz bottle of beer (355 mL), one 5 oz glass of wine (148 mL), or one 1 oz glass of hard liquor (44 mL). Lifestyle  Take daily care of your teeth and gums.  Stay active. Exercise for at least 30 minutes on 5 or more days each week.  Do not use any products that contain nicotine or tobacco, such as cigarettes, e-cigarettes, and chewing tobacco. If you need help quitting, ask your health care provider.  If you are sexually active, practice safe sex. Use a condom or other form of protection to prevent STIs (sexually transmitted infections).  Talk with your health care provider about taking a low-dose aspirin every day starting at age 53. What's next?  Go to your health care provider once a year for a well check visit.  Ask your health care provider how often you should have your eyes and teeth checked.  Stay up to date on all vaccines. This information is not intended to replace advice given to you by your health care provider. Make sure you discuss any questions you have with your health care provider. Document Revised: 08/01/2018 Document Reviewed: 08/01/2018 Elsevier Patient Education  2020 [employer].

## 2020-02-27 ENCOUNTER — Other Ambulatory Visit (INDEPENDENT_AMBULATORY_CARE_PROVIDER_SITE_OTHER): Payer: BC Managed Care – PPO

## 2020-02-27 DIAGNOSIS — Z0000 Encounter for general adult medical examination without abnormal findings: Secondary | ICD-10-CM

## 2020-02-27 DIAGNOSIS — E785 Hyperlipidemia, unspecified: Secondary | ICD-10-CM

## 2020-02-27 DIAGNOSIS — Z1159 Encounter for screening for other viral diseases: Secondary | ICD-10-CM | POA: Diagnosis not present

## 2020-02-27 DIAGNOSIS — E1169 Type 2 diabetes mellitus with other specified complication: Secondary | ICD-10-CM

## 2020-02-27 DIAGNOSIS — R42 Dizziness and giddiness: Secondary | ICD-10-CM

## 2020-02-27 DIAGNOSIS — I10 Essential (primary) hypertension: Secondary | ICD-10-CM

## 2020-02-27 LAB — CBC WITH DIFFERENTIAL/PLATELET
Basophils Absolute: 0.1 10*3/uL (ref 0.0–0.1)
Basophils Relative: 2.8 % (ref 0.0–3.0)
Eosinophils Absolute: 0.6 10*3/uL (ref 0.0–0.7)
Eosinophils Relative: 11.3 % — ABNORMAL HIGH (ref 0.0–5.0)
HCT: 41 % (ref 39.0–52.0)
Hemoglobin: 13.6 g/dL (ref 13.0–17.0)
Lymphocytes Relative: 27 % (ref 12.0–46.0)
Lymphs Abs: 1.4 10*3/uL (ref 0.7–4.0)
MCHC: 33.2 g/dL (ref 30.0–36.0)
MCV: 88.5 fl (ref 78.0–100.0)
Monocytes Absolute: 0.5 10*3/uL (ref 0.1–1.0)
Monocytes Relative: 8.7 % (ref 3.0–12.0)
Neutro Abs: 2.6 10*3/uL (ref 1.4–7.7)
Neutrophils Relative %: 50.2 % (ref 43.0–77.0)
Platelets: 171 10*3/uL (ref 150.0–400.0)
RBC: 4.64 Mil/uL (ref 4.22–5.81)
RDW: 15.8 % — ABNORMAL HIGH (ref 11.5–15.5)
WBC: 5.2 10*3/uL (ref 4.0–10.5)

## 2020-02-27 LAB — TSH: TSH: 0.94 u[IU]/mL (ref 0.35–4.50)

## 2020-02-27 LAB — LIPID PANEL
Cholesterol: 161 mg/dL (ref 0–200)
HDL: 69.7 mg/dL (ref >39.00)
LDL Cholesterol: 66 mg/dL (ref 0–99)
NonHDL: 90.88
Total CHOL/HDL Ratio: 2
Triglycerides: 124 mg/dL (ref 0.0–149.0)
VLDL: 24.8 mg/dL (ref 0.0–40.0)

## 2020-02-27 LAB — T4, FREE: Free T4: 0.73 ng/dL (ref 0.60–1.60)

## 2020-02-27 LAB — COMPREHENSIVE METABOLIC PANEL
ALT: 22 U/L (ref 0–53)
AST: 19 U/L (ref 0–37)
Albumin: 4.3 g/dL (ref 3.5–5.2)
Alkaline Phosphatase: 61 U/L (ref 39–117)
BUN: 18 mg/dL (ref 6–23)
CO2: 29 mEq/L (ref 19–32)
Calcium: 9.6 mg/dL (ref 8.4–10.5)
Chloride: 106 mEq/L (ref 96–112)
Creatinine, Ser: 0.91 mg/dL (ref 0.40–1.50)
GFR: 86.29 mL/min (ref >60.00)
Glucose, Bld: 125 mg/dL — ABNORMAL HIGH (ref 70–99)
Potassium: 4.4 mEq/L (ref 3.5–5.1)
Sodium: 143 mEq/L (ref 135–145)
Total Bilirubin: 0.6 mg/dL (ref 0.2–1.2)
Total Protein: 6.7 g/dL (ref 6.0–8.3)

## 2020-02-27 LAB — HEMOGLOBIN A1C: Hgb A1c MFr Bld: 6.1 % (ref 4.6–6.5)

## 2020-03-01 LAB — HEPATITIS C ANTIBODY
Hepatitis C Ab: NONREACTIVE
SIGNAL TO CUT-OFF: 0.01 (ref <1.00)

## 2020-03-05 ENCOUNTER — Encounter: Payer: Self-pay | Admitting: Family Medicine

## 2020-03-11 ENCOUNTER — Other Ambulatory Visit: Payer: Self-pay | Admitting: Family Medicine

## 2020-03-11 DIAGNOSIS — K219 Gastro-esophageal reflux disease without esophagitis: Secondary | ICD-10-CM

## 2020-03-18 DIAGNOSIS — G4733 Obstructive sleep apnea (adult) (pediatric): Secondary | ICD-10-CM | POA: Diagnosis not present

## 2020-03-22 DIAGNOSIS — M25562 Pain in left knee: Secondary | ICD-10-CM | POA: Diagnosis not present

## 2020-03-29 ENCOUNTER — Other Ambulatory Visit: Payer: Self-pay | Admitting: Orthopaedic Surgery

## 2020-03-29 DIAGNOSIS — M25562 Pain in left knee: Secondary | ICD-10-CM

## 2020-04-01 ENCOUNTER — Ambulatory Visit
Admission: RE | Admit: 2020-04-01 | Discharge: 2020-04-01 | Disposition: A | Discharge status: Home / Self Care | Payer: BC Managed Care – PPO | Source: Ambulatory Visit | Attending: Orthopaedic Surgery | Admitting: Orthopaedic Surgery

## 2020-04-01 ENCOUNTER — Other Ambulatory Visit: Payer: Self-pay

## 2020-04-01 DIAGNOSIS — M25562 Pain in left knee: Secondary | ICD-10-CM

## 2020-04-07 DIAGNOSIS — F411 Generalized anxiety disorder: Secondary | ICD-10-CM | POA: Diagnosis not present

## 2020-04-19 ENCOUNTER — Other Ambulatory Visit: Payer: BC Managed Care – PPO

## 2020-04-21 DIAGNOSIS — M25562 Pain in left knee: Secondary | ICD-10-CM | POA: Diagnosis not present

## 2020-04-26 ENCOUNTER — Other Ambulatory Visit: Payer: Self-pay | Admitting: Family Medicine

## 2020-04-30 DIAGNOSIS — C61 Malignant neoplasm of prostate: Secondary | ICD-10-CM | POA: Diagnosis not present

## 2020-05-12 DIAGNOSIS — C61 Malignant neoplasm of prostate: Secondary | ICD-10-CM | POA: Diagnosis not present

## 2020-05-12 DIAGNOSIS — N5201 Erectile dysfunction due to arterial insufficiency: Secondary | ICD-10-CM | POA: Diagnosis not present

## 2020-05-12 DIAGNOSIS — M25562 Pain in left knee: Secondary | ICD-10-CM | POA: Diagnosis not present

## 2020-07-05 ENCOUNTER — Other Ambulatory Visit: Payer: Self-pay | Admitting: Family Medicine

## 2020-07-08 DIAGNOSIS — Y999 Unspecified external cause status: Secondary | ICD-10-CM | POA: Diagnosis not present

## 2020-07-08 DIAGNOSIS — S83232A Complex tear of medial meniscus, current injury, left knee, initial encounter: Secondary | ICD-10-CM | POA: Diagnosis not present

## 2020-07-08 DIAGNOSIS — X58XXXA Exposure to other specified factors, initial encounter: Secondary | ICD-10-CM | POA: Diagnosis not present

## 2020-07-08 DIAGNOSIS — M1712 Unilateral primary osteoarthritis, left knee: Secondary | ICD-10-CM | POA: Diagnosis not present

## 2020-07-28 DIAGNOSIS — Z9889 Other specified postprocedural states: Secondary | ICD-10-CM | POA: Diagnosis not present

## 2020-07-28 DIAGNOSIS — M1712 Unilateral primary osteoarthritis, left knee: Secondary | ICD-10-CM | POA: Diagnosis not present

## 2020-10-02 ENCOUNTER — Other Ambulatory Visit: Payer: Self-pay | Admitting: Family Medicine

## 2020-12-08 LAB — HM DIABETES EYE EXAM

## 2020-12-30 ENCOUNTER — Other Ambulatory Visit: Payer: Self-pay | Admitting: Family Medicine

## 2021-02-04 ENCOUNTER — Emergency Department (HOSPITAL_COMMUNITY)
Admission: EM | Admit: 2021-02-04 | Discharge: 2021-02-04 | Disposition: A | Discharge status: Home / Self Care | Payer: PRIVATE HEALTH INSURANCE | Attending: Emergency Medicine | Admitting: Emergency Medicine

## 2021-02-04 ENCOUNTER — Encounter (HOSPITAL_COMMUNITY): Payer: Self-pay

## 2021-02-04 ENCOUNTER — Other Ambulatory Visit: Payer: Self-pay

## 2021-02-04 ENCOUNTER — Emergency Department (HOSPITAL_COMMUNITY): Payer: PRIVATE HEALTH INSURANCE

## 2021-02-04 DIAGNOSIS — Z7982 Long term (current) use of aspirin: Secondary | ICD-10-CM | POA: Diagnosis not present

## 2021-02-04 DIAGNOSIS — Z7984 Long term (current) use of oral hypoglycemic drugs: Secondary | ICD-10-CM | POA: Insufficient documentation

## 2021-02-04 DIAGNOSIS — Z79899 Other long term (current) drug therapy: Secondary | ICD-10-CM | POA: Diagnosis not present

## 2021-02-04 DIAGNOSIS — W19XXXA Unspecified fall, initial encounter: Secondary | ICD-10-CM

## 2021-02-04 DIAGNOSIS — S0990XA Unspecified injury of head, initial encounter: Secondary | ICD-10-CM | POA: Diagnosis present

## 2021-02-04 DIAGNOSIS — W108XXA Fall (on) (from) other stairs and steps, initial encounter: Secondary | ICD-10-CM | POA: Insufficient documentation

## 2021-02-04 DIAGNOSIS — S0101XA Laceration without foreign body of scalp, initial encounter: Secondary | ICD-10-CM | POA: Insufficient documentation

## 2021-02-04 DIAGNOSIS — Z96653 Presence of artificial knee joint, bilateral: Secondary | ICD-10-CM | POA: Insufficient documentation

## 2021-02-04 DIAGNOSIS — E119 Type 2 diabetes mellitus without complications: Secondary | ICD-10-CM | POA: Insufficient documentation

## 2021-02-04 DIAGNOSIS — I10 Essential (primary) hypertension: Secondary | ICD-10-CM | POA: Insufficient documentation

## 2021-02-04 DIAGNOSIS — Z8546 Personal history of malignant neoplasm of prostate: Secondary | ICD-10-CM | POA: Insufficient documentation

## 2021-02-04 DIAGNOSIS — T148XXA Other injury of unspecified body region, initial encounter: Secondary | ICD-10-CM

## 2021-02-04 NOTE — ED Notes (Signed)
Pt passed his fluid challenge.

## 2021-02-04 NOTE — ED Triage Notes (Signed)
Patient BIB [facility] from home. Patient got back from a funeral and patient has been using alcohol. Patient tried walking upstairs and fell backwards on concrete. Conscious and alert and oriented upon arrival. Laceration to the back of his 2cm. Patient takes aspirin.

## 2021-02-04 NOTE — Discharge Instructions (Addendum)
You need to have staples removed in 1 week  Return for new or worsening symptoms

## 2021-02-04 NOTE — ED Provider Notes (Signed)
[facility] DEPT Provider Note   CSN: [id number] Arrival date & time: 02/04/21  2006     History Chief Complaint  Patient presents with   Andrew Gaines is a 55 y.o. male with past medical history significant for diabetes, DVT not on anticoagulation who presents for evaluation of fall.  Apparently just got back from a funeral as his best friend's son recently passed away.  He had been drinking.  He attempted to walk up the stairs got about 3 steps up, tripped and fell backwards.  Hit head on concrete.  Tetanus up-to-date.  He denies any complaints.  He has been ambulatory since.  No headache, lightness, dizziness, emesis, chest pain, shortness of breath.  Does take a baby aspirin daily.  Denies additional aggravating or alleviating factors  Level 5 caveat- Alcohol intoxication  HPI     Past Medical History:  Diagnosis Date   Cancer ([place]) 06/2014   prostate cancer    Complication of anesthesia    n&v   Diabetes mellitus without complication ([place])    DVT (deep venous thrombosis) ([place])    and PE after knee surgery "years ago"   GERD (gastroesophageal reflux disease)    Hiatal hernia    PONV (postoperative nausea and vomiting)    Sleep apnea    scheduled to get CPAP    Unspecified essential hypertension    Unspecified sinusitis (chronic)     Patient Active Problem List   Diagnosis Date Noted   Hyperlipidemia 01/28/2016   Prostate cancer ([place]) 07/20/2014   Adderall use disorder, 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   Other and unspecified hyperlipidemia 03/02/2014   Optic neuritis 10/30/2013   Gastro-esophageal reflux disease with esophagitis 01/29/2013   Difficulty swallowing solids 01/29/2013   Facial pain, acute 03/25/2012   OBSTRUCTIVE SLEEP APNEA 12/15/2009   SNORING 12/14/2009   DVT 12/08/2009   Essential hypertension 11/22/2009   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   ALLERGIC RHINITIS 11/22/2009    Past Surgical  History:  Procedure Laterality Date   JOINT REPLACEMENT     bilateral knee    KNEE ARTHROSCOPY     bilateral    LYMPHADENECTOMY Bilateral 07/20/2014   Procedure: LYMPHADENECTOMY;  Surgeon: Raynelle Bring, MD;  Location: [facility];  Service: Urology;  Laterality: Bilateral;   ROBOT ASSISTED LAPAROSCOPIC RADICAL PROSTATECTOMY N/A 07/20/2014   Procedure: ROBOTIC ASSISTED LAPAROSCOPIC RADICAL PROSTATECTOMY LEVEL 2;  Surgeon: Raynelle Bring, MD;  Location: [facility];  Service: Urology;  Laterality: N/A;   SHOULDER ARTHROSCOPY     bilateral        Family History  Problem Relation Age of Onset   Breast cancer Maternal Grandmother    Colon cancer Paternal Grandmother 58   Esophageal cancer Neg Hx    Rectal cancer Neg Hx    Stomach cancer Neg Hx     Social History   Tobacco Use   Smoking status: Never   Smokeless tobacco: Never  Vaping Use   Vaping Use: Never used  Substance Use Topics   Alcohol use: Yes    Comment: 2-3 times per month   Drug use: No    Home Medications Prior to Admission medications   Medication Sig Start Date End Date Taking? Authorizing Provider  aspirin EC 81 MG tablet Take 81 mg by mouth daily.    [provider]  atorvastatin (LIPITOR) 20 MG tablet TAKE 1 TABLET BY MOUTH EVERY DAY 10/04/20   Billie Ruddy, MD  escitalopram (LEXAPRO) 20 MG tablet Take 20 mg by mouth daily. 12/14/19   [provider]  losartan (COZAAR) 50 MG tablet TAKE 1 TABLET BY MOUTH EVERY DAY IN THE MORNING 07/06/20   Billie Ruddy, MD  meloxicam (MOBIC) 15 MG tablet Take 15 mg by mouth 3 (three) times daily. 02/10/20   [provider]  metFORMIN (GLUCOPHAGE) 500 MG tablet TAKE 1 TABLET BY MOUTH TWICE A DAY WITH MEALS 12/30/20   Billie Ruddy, MD  omeprazole (PRILOSEC) 40 MG capsule TAKE 1 CAPSULE BY MOUTH EVERYDAY AT BEDTIME 03/11/20   Billie Ruddy, MD  sildenafil (REVATIO) 20 MG tablet Take 20 mg by mouth as needed.    [provider]     Allergies    Patient has no known allergies.  Review of Systems   Review of Systems  Constitutional: Negative.   HENT: Negative.    Respiratory: Negative.    Cardiovascular: Negative.   Gastrointestinal: Negative.   Genitourinary: Negative.   Musculoskeletal: Negative.   Skin: Negative.   Neurological: Negative.   All other systems reviewed and are negative.  Physical Exam Updated Vital Signs BP (!) 142/92   Pulse 81   Temp 97.6 F (36.4 C) (Oral)   Resp 18   Ht 6' (1.829 m)   Wt 105.8 kg   SpO2 99%   BMI 31.63 kg/m   Physical Exam Vitals and nursing note reviewed.  Constitutional:      General: He is not in acute distress.    Appearance: He is well-developed. He is not ill-appearing, toxic-appearing or diaphoretic.     Comments: Smells of alcohol   HENT:     Head: Normocephalic.     Comments: Moderate blood caked to back of head.  Do not see active bleed. No facial tenderness    Ears:     Comments: No hemotympanum     Nose: Nose normal.     Mouth/Throat:     Mouth: Mucous membranes are moist.  Eyes:     Pupils: Pupils are equal, round, and reactive to light.  Neck:     Comments:  Collar in place Cardiovascular:     Rate and Rhythm: Normal rate and regular rhythm.     Pulses: Normal pulses.     Heart sounds: Normal heart sounds.  Pulmonary:     Effort: Pulmonary effort is normal. No respiratory distress.     Breath sounds: Normal breath sounds.     Comments: Speaks in full sentences without difficulty Abdominal:     General: Bowel sounds are normal. There is no distension.     Palpations: Abdomen is soft.     Tenderness: There is no abdominal tenderness. There is no right CVA tenderness or left CVA tenderness.  Musculoskeletal:        General: No swelling, tenderness, deformity or signs of injury. Normal range of motion.     Right lower leg: No edema.     Left lower leg: No edema.     Comments: No bony tenderness to midline thoracic and lumbar  region.  No bony tenderness to extremities.  Moves all 4 extremities without difficulty.  Skin:    General: Skin is warm and dry.     Capillary Refill: Capillary refill takes less than 2 seconds.  Neurological:     General: No focal deficit present.     Mental Status: He is alert and oriented to person, place, and time.     Comments: Cranial  nerves 2-12 grossly intact Follows commands Equal strength    ED Results / Procedures / Treatments   Labs (all labs ordered are listed, but only abnormal results are displayed) Labs Reviewed - No data to display  EKG None  Radiology CT Head Wo Contrast  Result Date: 02/04/2021 CLINICAL DATA:  Fall with pain to back of head and neck. EXAM: CT HEAD WITHOUT CONTRAST TECHNIQUE: Contiguous axial images were obtained from the base of the skull through the vertex without intravenous contrast. COMPARISON:  None. FINDINGS: Brain: No intracranial hemorrhage, mass effect, or midline shift. No hydrocephalus. The basilar cisterns are patent. No evidence of territorial infarct or acute ischemia. No extra-axial or intracranial fluid collection. Vascular: No hyperdense vessel or unexpected calcification. Skull: No fracture or focal lesion. Sinuses/Orbits: Mucosal thickening involving ethmoid air cells, sphenoid sinus and bilateral maxillary sinuses. No acute fracture. Mastoid air cells are clear. Other: Right parietal scalp hematoma. IMPRESSION: 1. Right parietal scalp hematoma. No acute intracranial abnormality. No skull fracture. 2. Mild paranasal sinus disease. Electronically Signed   By: Keith Rake M.D.   On: 02/04/2021 21:08   CT Cervical Spine Wo Contrast  Result Date: 02/04/2021 CLINICAL DATA:  Post fall with pain and back of head and neck. EXAM: CT CERVICAL SPINE WITHOUT CONTRAST TECHNIQUE: Multidetector CT imaging of the cervical spine was performed without intravenous contrast. Multiplanar CT image reconstructions were also generated. COMPARISON:   None. FINDINGS: Alignment: Minimal broad-based rightward curvature is likely related to positioning. No traumatic subluxation. Facets are normally aligned. Skull base and vertebrae: No acute fracture. Vertebral body heights are maintained. The dens and skull base are intact. Soft tissues and spinal canal: No prevertebral fluid or swelling. No visible canal hematoma. Disc levels: Disc space narrowing with endplate spurring at X3-A3 and C6-C7. mild disc space narrowing at C3-C4. No high-grade canal stenosis. Upper chest: No acute findings. Other: None. IMPRESSION: Mild degenerative change in the cervical spine without acute fracture or subluxation. Electronically Signed   By: Keith Rake M.D.   On: 02/04/2021 21:11    Procedures .Marland KitchenLaceration Repair  Date/Time: 02/04/2021 9:24 PM Performed by: Nettie Elm, PA-C Authorized by: Nettie Elm, PA-C   Consent:    Consent obtained:  Verbal   Consent given by:  Patient   Risks discussed:  Infection, need for additional repair, pain, poor cosmetic result and poor wound healing   Alternatives discussed:  No treatment and delayed treatment Universal protocol:    Procedure explained and questions answered to patient or proxy's satisfaction: yes     Relevant documents present and verified: yes     Test results available: yes     Imaging studies available: yes     Required blood products, implants, devices, and special equipment available: yes     Site/side marked: yes     Immediately prior to procedure, a time out was called: yes     Patient identity confirmed:  Verbally with patient Anesthesia:    Anesthesia method:  None Laceration details:    Location:  Scalp   Scalp location:  R parietal   Length (cm):  2   Depth (mm):  3 Pre-procedure details:    Preparation:  Patient was prepped and draped in usual sterile fashion and imaging obtained to evaluate for foreign bodies Exploration:    Imaging obtained comment:  Ct head   Imaging  outcome: foreign body not noted     Wound exploration: wound explored through full range of motion and  entire depth of wound visualized     Wound extent: no areolar tissue violation noted, no fascia violation noted, no foreign bodies/material noted, no muscle damage noted, no nerve damage noted, no tendon damage noted, no underlying fracture noted and no vascular damage noted     Contaminated: no   Treatment:    Area cleansed with:  Povidone-iodine   Amount of cleaning:  Standard   Irrigation solution:  Sterile saline   Irrigation method:  Pressure wash Skin repair:    Repair method:  Staples   Number of staples:  4 Approximation:    Approximation:  Close Repair type:    Repair type:  Intermediate Post-procedure details:    Dressing:  Open (no dressing)   Procedure completion:  Tolerated well, no immediate complications   Medications Ordered in ED Medications - No data to display  ED Course  I have reviewed the triage vital signs and the nursing notes.  Pertinent labs & imaging results that were available during my care of the patient were reviewed by me and considered in my medical decision making (see chart for details).  Here for evaluation after witnessed fall down 3 steps  He is in c-collar.  Moderate take blood to back of head. He does smell of alcohol however does not any slurred speech, follows commands.  His heart and lungs are clear but abdomen soft, nontender.  He has a nonfocal neuro exam without deficits.  No midline thoracic or lumbar tenderness.  He is refusing labs.  We will plan on CT head, cervical spine, have nursing clean his head to assess for laceration  CT head with parietal scalp hematoma CT cervical with chronic degenerative changes. No acute findings  Patient reassessed. #4 staples to scalp placed. No active bleeding.  Patient ambulatory to restroom down the hall without any assistance.  He is tolerating p.o. intake.  Wife will take patient home.   Contracts for safety.  Discussed return precautions.  Patient and wife agreeable.  The patient has been appropriately medically screened and/or stabilized in the ED. I have low suspicion for any other emergent medical condition which would require further screening, evaluation or treatment in the ED or require inpatient management.  Patient is hemodynamically stable and in no acute distress.  Patient able to ambulate in department prior to ED.  Evaluation does not show acute pathology that would require ongoing or additional emergent interventions while in the emergency department or further inpatient treatment.  I have discussed the diagnosis with the patient and answered all questions.  Pain is been managed while in the emergency department and patient has no further complaints prior to discharge.  Patient is comfortable with plan discussed in room and is stable for discharge at this time.  I have discussed strict return precautions for returning to the emergency department.  Patient was encouraged to follow-up with PCP/specialist refer to at discharge.      MDM Rules/Calculators/A&P                           Final Clinical Impression(s) / ED Diagnoses Final diagnoses:  Fall, initial encounter  Laceration of scalp, initial encounter  Hematoma    Rx / DC Orders ED Discharge Orders     None        Remell Giaimo A, PA-C 02/04/21 2149    Daleen Bo, MD 02/05/21 1141

## 2021-02-04 NOTE — ED Notes (Signed)
Pt ambulated by himself. Pt was not light headed or dizzy.

## 2021-02-09 ENCOUNTER — Other Ambulatory Visit: Payer: Self-pay | Admitting: Family Medicine

## 2021-02-10 ENCOUNTER — Other Ambulatory Visit: Payer: Self-pay

## 2021-02-11 ENCOUNTER — Ambulatory Visit (INDEPENDENT_AMBULATORY_CARE_PROVIDER_SITE_OTHER): Payer: 59 | Admitting: Family Medicine

## 2021-02-11 ENCOUNTER — Encounter: Payer: Self-pay | Admitting: Family Medicine

## 2021-02-11 VITALS — BP 128/86 | HR 101 | Temp 98.8°F | Wt 258.4 lb

## 2021-02-11 DIAGNOSIS — Z4802 Encounter for removal of sutures: Secondary | ICD-10-CM

## 2021-02-11 DIAGNOSIS — S0101XD Laceration without foreign body of scalp, subsequent encounter: Secondary | ICD-10-CM

## 2021-02-11 NOTE — Progress Notes (Addendum)
Subjective:    Patient ID: Andrew Gaines, male    DOB: 1965-12-14, 55 y.o.   MRN: [id number]  Chief Complaint  Patient presents with   Suture / Staple Removal     HPI Patient was seen today for staple removal.  Seen in ED on 02/04/21 s/p fall on stairs hitting head.  Denies LOC.  EtOH a contributing factor per chart review.  Required 4 staples in R medial parietal area for a 2 cm long laceration.  Pt denies fever, chills, n/v, pain in scalp, HA.  Tdap up-to-date, given 03/02/2014. Past Medical History:  Diagnosis Date   Cancer ([place]) 06/2014   prostate cancer    Complication of anesthesia    n&v   Diabetes mellitus without complication (HCC)    DVT (deep venous thrombosis) (HCC)    and PE after knee surgery "years ago"   GERD (gastroesophageal reflux disease)    Hiatal hernia    PONV (postoperative nausea and vomiting)    Sleep apnea    scheduled to get CPAP    Unspecified essential hypertension    Unspecified sinusitis (chronic)     No Known Allergies  ROS General: Denies fever, chills, night sweats, changes in weight, changes in appetite HEENT: Denies headaches, ear pain, changes in vision, rhinorrhea, sore throat CV: Denies CP, palpitations, SOB, orthopnea Pulm: Denies SOB, cough, wheezing GI: Denies abdominal pain, nausea, vomiting, diarrhea, constipation GU: Denies dysuria, hematuria, frequency Msk: Denies muscle cramps, joint pains Neuro: Denies weakness, numbness, tingling Skin: Denies rashes, bruising + scalp laceration with staples in place Psych: Denies depression, anxiety, hallucinations     Objective:    Blood pressure 128/86, pulse (!) 101, temperature 98.8 F (37.1 C), temperature source Oral, weight 258 lb 6.4 oz (117.2 kg), SpO2 93 %.  Gen. Pleasant, well-nourished, in no distress, normal affect   HEENT: [place]/AT, face symmetric, conjunctiva clear, no scleral icterus, PERRLA, EOMI, nares patent without drainage Lungs: no accessory muscle  use Cardiovascular: Tachycardia, no peripheral edema Musculoskeletal: No deformities, no cyanosis or clubbing, normal tone Neuro:  A&Ox3, CN II-XII intact, normal gait Skin:  Warm, dry.  Right parietal area of scalp with a healing laceration with dried blood and 4 staples in place.  No erythema or drainage noted from laceration.   Wt Readings from Last 3 Encounters:  02/11/21 258 lb 6.4 oz (117.2 kg)  02/04/21 233 lb 4 oz (105.8 kg)  02/26/20 233 lb 4 oz (105.8 kg)    Lab Results  Component Value Date   WBC 5.2 02/27/2020   HGB 13.6 02/27/2020   HCT 41.0 02/27/2020   PLT 171.0 02/27/2020   GLUCOSE 125 (H) 02/27/2020   CHOL 161 02/27/2020   TRIG 124.0 02/27/2020   HDL 69.70 02/27/2020   LDLDIRECT 138.0 01/31/2010   LDLCALC 66 02/27/2020   ALT 22 02/27/2020   AST 19 02/27/2020   NA 143 02/27/2020   K 4.4 02/27/2020   CL 106 02/27/2020   CREATININE 0.91 02/27/2020   BUN 18 02/27/2020   CO2 29 02/27/2020   TSH 0.94 02/27/2020   PSA 5.56 (H) 02/10/2014   INR 2.0 05/09/2010   HGBA1C 6.1 02/27/2020    Assessment/Plan:  Encounter for staple removal  Laceration of scalp, subsequent encounter  Consent obtained.  4 staples removed.  Patient tolerated procedure well. Advised to keep area clean and dry.   Avoid submerging or vigourous scrubbing.   Monitor for s/s of infection.  F/u as needed  Grier Mitts, MD

## 2021-03-27 ENCOUNTER — Other Ambulatory Visit: Payer: Self-pay | Admitting: Family Medicine

## 2021-05-17 ENCOUNTER — Encounter: Payer: Self-pay | Admitting: Family Medicine

## 2021-05-17 ENCOUNTER — Telehealth (INDEPENDENT_AMBULATORY_CARE_PROVIDER_SITE_OTHER): Payer: 59 | Admitting: Family Medicine

## 2021-05-17 DIAGNOSIS — F19939 Other psychoactive substance use, unspecified with withdrawal, unspecified: Secondary | ICD-10-CM | POA: Diagnosis not present

## 2021-05-17 DIAGNOSIS — T50905A Adverse effect of unspecified drugs, medicaments and biological substances, initial encounter: Secondary | ICD-10-CM

## 2021-05-17 NOTE — Progress Notes (Signed)
   Subjective:    Patient ID: Andrew Gaines, male    DOB: 06/28/1966, 55 y.o.   MRN: [id number]  HPI Virtual Visit via Telephone Note  I connected with the patient on 05/17/21 at  4:00 PM EDT by telephone and verified that I am speaking with the correct person using two identifiers.   I discussed the limitations, risks, security and privacy concerns of performing an evaluation and management service by telephone and the availability of in person appointments. I also discussed with the patient that there may be a patient responsible charge related to this service. The patient expressed understanding and agreed to proceed.  Location patient: home Location provider: work or home office Participants present for the call: patient, provider Patient did not have a visit in the prior 7 days to address this/these issue(s).   History of Present Illness: Here for 2 days of feeling weak, dizzy, and groggy and with having a headache. Today he feels much better that yesterday. He thinks these may be side effects of medication. He sees Dr. Chucky May for treatment of depression, and he had been taking Lexapro for several years. He recently asked her if he could try something else to see if it worked better. About one week ago she advised him to stop the Lexapro and she started him on Gabapentin to take 100 mg TID. He stopped the Lexapro abruptly and started on Gabapentin 5 days ago. The he woke up yesterday morning feeling terrible with the above symptoms. He took the last Gabapentin yesterday morning and then stopped it. Today he is feeling much better. No fever or body aches or ST or cough or NVD.    Observations/Objective: Patient sounds cheerful and well on the phone. I do not appreciate any SOB. Speech and thought processing are grossly intact. Patient reported vitals:  Assessment and Plan: He seems to be reacting to several things at once. First he is not tolerating the Gabapentin at all, so we  agreed for him to stay off this. In addition I think he was having some withdrawal symptoms from stopping the Lexapro too abruptly. He agreed to get back on the Lexapro , at least for the time being, and he will make an appt with Dr. Volanda Napoleon (his PCP) to address the depression.  Alysia Penna, MD    Follow Up Instructions:     [phone] 11-20 [phone] 21-30 I did not refer this patient for an OV in the next 24 hours for this/these issue(s).  I discussed the assessment and treatment plan with the patient. The patient was provided an opportunity to ask questions and all were answered. The patient agreed with the plan and demonstrated an understanding of the instructions.   The patient was advised to call back or seek an in-person evaluation if the symptoms worsen or if the condition fails to improve as anticipated.  I provided 25 minutes of non-face-to-face time during this encounter.   Alysia Penna, MD     Review of Systems     Objective:   Physical Exam        Assessment & Plan:

## 2021-06-23 ENCOUNTER — Other Ambulatory Visit: Payer: Self-pay | Admitting: Family Medicine

## 2021-07-06 ENCOUNTER — Other Ambulatory Visit: Payer: Self-pay | Admitting: Family Medicine

## 2021-08-03 ENCOUNTER — Other Ambulatory Visit: Payer: Self-pay | Admitting: Family Medicine

## 2021-08-05 ENCOUNTER — Telehealth: Payer: Self-pay | Admitting: Family Medicine

## 2021-08-05 NOTE — Telephone Encounter (Signed)
Unfortunately, this provider is not accepting new pts.

## 2021-08-05 NOTE — Telephone Encounter (Signed)
Patient called stating that he went to another doctor that his wife recommended and was taken off of escitalopram (LEXAPRO) 20 MG tablet . Patient says that he was then put on gabapentin and that it really "jacked him up". Patient says that he no longer wants to go to the doctor his wife recommended and wants to know if Dr. Volanda Napoleon will prescribe him escitalopram.  Patient uses [id number] - [place] [place] - [place]. AT [address].  Patient can be contacted at [phone].  Please advise.

## 2021-08-18 NOTE — Telephone Encounter (Signed)
error 

## 2021-08-22 ENCOUNTER — Other Ambulatory Visit: Payer: Self-pay | Admitting: Family Medicine

## 2021-08-25 NOTE — Telephone Encounter (Signed)
Spoke with pt, to inform that an appointment is needed as pt's last visit was only for suture removal. Pt then got loud and asked why did he need to come in for a medication he has been on for 7 years that was prescribed by another doctor, advised him that if it was prescribed by another provider, that he would have to come in, as PCP has not seen him for any reason to prescribe the medication. Pt then wen on a rant about his insurance, and the [facility] office, and that nothing is being told to him in full. Stated he only gets half the information, wanted to know why PCP did not call him back as he requested, I informed him that PCP was not able to call patients unless they are scheduled for an appointment. Offered to get him scheduled, he stated he already has an appt for 1/06 at 2:30 pm and will be here. Looked at the schedule ad he was not on schedule, and per previous note, pt declined appt. Informed him that slot was not available and he got loud again stating he never declined appt, because he wanted PCP to call him, and wanted to know when she did not call but I did. Again informed him the PCP is not able to call or return patient calls unless it is a scheduled appointment, pt states he has been trying to get PCP to refill medication for 3 weeks and he has 2 pills left and needs the medication. Offered to make an appt, he stated he will be out of town next week and because he was offered the appt for 1/06 at 2:30 pm he will be here. He was advised PCP does not have availability at that time, he asked for someone else. Went to [place], she was in a meeting but advised she would return his call.

## 2021-08-25 NOTE — Telephone Encounter (Signed)
Was requested to call patient back did not get an answer. Pt will need to schedule an appointment with Dr. Volanda Napoleon for medication refill.

## 2021-08-25 NOTE — Telephone Encounter (Signed)
Pt was offered an appt with dr banks on 08-26-2021 at 230 pm pt decline the appt . I explained to pt it has been at least 6 months and md would like for him to make an appt the patient states it will be a waste of provider time and his and he would like dr banks to call him.

## 2021-08-26 NOTE — Telephone Encounter (Signed)
Pt called into the office he has scheduled an appointment for 09/05/21. He would like to know if he could get enough LEXAPRO to hold him over until this appointment.

## 2021-09-05 ENCOUNTER — Encounter: Payer: Self-pay | Admitting: Family Medicine

## 2021-09-05 ENCOUNTER — Ambulatory Visit (INDEPENDENT_AMBULATORY_CARE_PROVIDER_SITE_OTHER): Payer: 59 | Admitting: Family Medicine

## 2021-09-05 VITALS — BP 138/90 | HR 72 | Temp 99.2°F | Wt 262.8 lb

## 2021-09-05 DIAGNOSIS — I10 Essential (primary) hypertension: Secondary | ICD-10-CM

## 2021-09-05 DIAGNOSIS — G4733 Obstructive sleep apnea (adult) (pediatric): Secondary | ICD-10-CM | POA: Diagnosis not present

## 2021-09-05 DIAGNOSIS — E785 Hyperlipidemia, unspecified: Secondary | ICD-10-CM

## 2021-09-05 DIAGNOSIS — R69 Illness, unspecified: Secondary | ICD-10-CM | POA: Diagnosis not present

## 2021-09-05 DIAGNOSIS — F339 Major depressive disorder, recurrent, unspecified: Secondary | ICD-10-CM | POA: Diagnosis not present

## 2021-09-05 DIAGNOSIS — Z9989 Dependence on other enabling machines and devices: Secondary | ICD-10-CM

## 2021-09-05 DIAGNOSIS — E1169 Type 2 diabetes mellitus with other specified complication: Secondary | ICD-10-CM

## 2021-09-05 LAB — LIPID PANEL
Cholesterol: 163 mg/dL (ref 0–200)
HDL: 61.4 mg/dL (ref >39.00)
LDL Cholesterol: 81 mg/dL (ref 0–99)
NonHDL: 102.07
Total CHOL/HDL Ratio: 3
Triglycerides: 106 mg/dL (ref 0.0–149.0)
VLDL: 21.2 mg/dL (ref 0.0–40.0)

## 2021-09-05 LAB — TSH: TSH: 1.33 u[IU]/mL (ref 0.35–5.50)

## 2021-09-05 LAB — BASIC METABOLIC PANEL
BUN: 15 mg/dL (ref 6–23)
CO2: 27 mEq/L (ref 19–32)
Calcium: 9.2 mg/dL (ref 8.4–10.5)
Chloride: 103 mEq/L (ref 96–112)
Creatinine, Ser: 0.89 mg/dL (ref 0.40–1.50)
GFR: 95.35 mL/min (ref >60.00)
Glucose, Bld: 148 mg/dL — ABNORMAL HIGH (ref 70–99)
Potassium: 4.3 mEq/L (ref 3.5–5.1)
Sodium: 138 mEq/L (ref 135–145)

## 2021-09-05 LAB — HEMOGLOBIN A1C: Hgb A1c MFr Bld: 7.1 % — ABNORMAL HIGH (ref 4.6–6.5)

## 2021-09-05 LAB — T4, FREE: Free T4: 0.9 ng/dL (ref 0.60–1.60)

## 2021-09-05 MED ORDER — LOSARTAN POTASSIUM 50 MG PO TABS: 75.0000 mg | ORAL_TABLET | Freq: Every day | ORAL | 1 refills | Status: DC

## 2021-09-05 MED ORDER — ESCITALOPRAM OXALATE 20 MG PO TABS: 20.0000 mg | ORAL_TABLET | Freq: Every day | ORAL | 1 refills | Status: DC

## 2021-09-05 NOTE — Progress Notes (Signed)
Subjective:    Patient ID: Andrew Gaines, male    DOB: 12-30-1965, 56 y.o.   MRN: [id number]  Chief Complaint  Patient presents with   Medication Refill    Wants to discuss medication that was prescribed by a previous provider. Lexapro    HPI Patient was seen today for f/u.  Pt previously seen by Dr. Berenda Morale for [facility] meds.  Was on lexapro, however was changed to gabapentin which did not go well.  Gabapentin caused decreased focus, decreased concentration, difficulty getting out of bed, "felt bad".   Pt initially wanted to switch from lexapro as he felt like it was not helping with his anger.  States restarted leftover lexapro and needs new rx.  States had a relative who is an NP fill the med so he wouldn't run out.  Pt endorses increased stress at home with his wife.  They have tried counseling.  Pt hasn't been checking his bp as he loaned his cuff to a friend.  Wears CPAP nightly.  Pt expressed frustration about having to be seen/"no one calling him back".  Per chart review several phone conversations listed.   Pt last seen by this provider 02/11/21 for staple removal and on 02/26/20 for CPE.    Past Medical History:  Diagnosis Date   Cancer ([place]) 06/2014   prostate cancer    Complication of anesthesia    n&v   Diabetes mellitus without complication (HCC)    DVT (deep venous thrombosis) (HCC)    and PE after knee surgery "years ago"   GERD (gastroesophageal reflux disease)    Hiatal hernia    PONV (postoperative nausea and vomiting)    Sleep apnea    scheduled to get CPAP    Unspecified essential hypertension    Unspecified sinusitis (chronic)     Allergies  Allergen Reactions   Gabapentin Other (See Comments)    Headache and dizziness     ROS General: Denies fever, chills, night sweats, changes in weight, changes in appetite HEENT: Denies headaches, ear pain, changes in vision, rhinorrhea, sore throat CV: Denies CP, palpitations, SOB, orthopnea Pulm: Denies SOB, cough,  wheezing GI: Denies abdominal pain, nausea, vomiting, diarrhea, constipation GU: Denies dysuria, hematuria, frequency Msk: Denies muscle cramps, joint pains Neuro: Denies weakness, numbness, tingling Skin: Denies rashes, bruising Psych: Denies depression, anxiety, hallucinations  +stress, depression     Objective:    Blood pressure 138/90, pulse 72, temperature 99.2 F (37.3 C), temperature source Oral, weight 262 lb 12.8 oz (119.2 kg), SpO2 95 %.  Gen. Pleasant, well-nourished, in no distress, normal affect   HEENT: [place]/AT, face symmetric, conjunctiva clear, no scleral icterus, PERRLA, EOMI, nares patent without drainage. Lungs: no accessory muscle use, CTAB, no wheezes or rales Cardiovascular: RRR, no m/r/g, no peripheral edema Musculoskeletal: No deformities, no cyanosis or clubbing, normal tone Neuro:  A&Ox3, CN II-XII intact, normal gait Skin:  Warm, no lesions/ rash   Wt Readings from Last 3 Encounters:  09/05/21 262 lb 12.8 oz (119.2 kg)  02/11/21 258 lb 6.4 oz (117.2 kg)  02/04/21 233 lb 4 oz (105.8 kg)    Lab Results  Component Value Date   WBC 5.2 02/27/2020   HGB 13.6 02/27/2020   HCT 41.0 02/27/2020   PLT 171.0 02/27/2020   GLUCOSE 125 (H) 02/27/2020   CHOL 161 02/27/2020   TRIG 124.0 02/27/2020   HDL 69.70 02/27/2020   LDLDIRECT 138.0 01/31/2010   LDLCALC 66 02/27/2020   ALT 22 02/27/2020  AST 19 02/27/2020   NA 143 02/27/2020   K 4.4 02/27/2020   CL 106 02/27/2020   CREATININE 0.91 02/27/2020   BUN 18 02/27/2020   CO2 29 02/27/2020   TSH 0.94 02/27/2020   PSA 5.56 (H) 02/10/2014   INR 2.0 05/09/2010   HGBA1C 6.1 02/27/2020   Depression screen PHQ 2/9 09/05/2021  Decreased Interest 1  Down, Depressed, Hopeless 1  PHQ - 2 Score 2  Altered sleeping 0  Tired, decreased energy 0  Change in appetite 0  Feeling bad or failure about yourself  1  Trouble concentrating 0  Moving slowly or fidgety/restless 0  Suicidal thoughts 0  PHQ-9 Score 3     Assessment/Plan:  Essential hypertension  -elevated -lifestyle modifications -increase losartan from 50 to 75 mg daily -check bp at home - Plan: Basic metabolic panel, losartan (COZAAR) 50 MG tablet  Depression, recurrent (HCC)  -stable -encouraged to start counseling/find a new [place] provider.  Given info on area clinics.  -continue lexapro 20 mg - Plan: escitalopram (LEXAPRO) 20 MG tablet, TSH, T4, Free  OSA on CPAP -continue CPAP  Hyperlipidemia associated with type 2 diabetes mellitus ([place])  - Plan: Hemoglobin A1c, Lipid panel  F/u in 3-4 months  Grier Mitts, MD

## 2021-09-05 NOTE — Patient Instructions (Signed)
Behavioral Health Services: -to make an appointment contact the office/provider you are interested in seeing.  No referral is needed.  This should help you get started.  [facility]  -[place]  [phone] -a place in town that has counseling and Psychiatry services.

## 2021-09-17 ENCOUNTER — Other Ambulatory Visit: Payer: Self-pay | Admitting: Family Medicine

## 2021-09-19 DIAGNOSIS — R69 Illness, unspecified: Secondary | ICD-10-CM | POA: Diagnosis not present

## 2021-09-22 DIAGNOSIS — R69 Illness, unspecified: Secondary | ICD-10-CM | POA: Diagnosis not present

## 2021-09-27 DIAGNOSIS — R69 Illness, unspecified: Secondary | ICD-10-CM | POA: Diagnosis not present

## 2021-10-13 DIAGNOSIS — G4733 Obstructive sleep apnea (adult) (pediatric): Secondary | ICD-10-CM | POA: Diagnosis not present

## 2021-10-13 DIAGNOSIS — R69 Illness, unspecified: Secondary | ICD-10-CM | POA: Diagnosis not present

## 2021-10-13 DIAGNOSIS — I10 Essential (primary) hypertension: Secondary | ICD-10-CM | POA: Diagnosis not present

## 2021-10-20 DIAGNOSIS — R69 Illness, unspecified: Secondary | ICD-10-CM | POA: Diagnosis not present

## 2021-10-26 DIAGNOSIS — R69 Illness, unspecified: Secondary | ICD-10-CM | POA: Diagnosis not present

## 2021-11-14 DIAGNOSIS — R69 Illness, unspecified: Secondary | ICD-10-CM | POA: Diagnosis not present

## 2021-11-22 DIAGNOSIS — R69 Illness, unspecified: Secondary | ICD-10-CM | POA: Diagnosis not present

## 2021-11-30 DIAGNOSIS — R69 Illness, unspecified: Secondary | ICD-10-CM | POA: Diagnosis not present

## 2021-12-13 LAB — HM DIABETES EYE EXAM

## 2021-12-14 ENCOUNTER — Encounter: Payer: Self-pay | Admitting: Family Medicine

## 2021-12-14 DIAGNOSIS — R69 Illness, unspecified: Secondary | ICD-10-CM | POA: Diagnosis not present

## 2021-12-28 DIAGNOSIS — R69 Illness, unspecified: Secondary | ICD-10-CM | POA: Diagnosis not present

## 2022-01-05 ENCOUNTER — Other Ambulatory Visit: Payer: Self-pay | Admitting: Family Medicine

## 2022-01-11 DIAGNOSIS — R69 Illness, unspecified: Secondary | ICD-10-CM | POA: Diagnosis not present

## 2022-01-20 DIAGNOSIS — G4733 Obstructive sleep apnea (adult) (pediatric): Secondary | ICD-10-CM | POA: Diagnosis not present

## 2022-01-25 DIAGNOSIS — R69 Illness, unspecified: Secondary | ICD-10-CM | POA: Diagnosis not present

## 2022-02-19 DIAGNOSIS — G4733 Obstructive sleep apnea (adult) (pediatric): Secondary | ICD-10-CM | POA: Diagnosis not present

## 2022-03-01 ENCOUNTER — Other Ambulatory Visit: Payer: Self-pay | Admitting: Family Medicine

## 2022-03-01 DIAGNOSIS — R69 Illness, unspecified: Secondary | ICD-10-CM | POA: Diagnosis not present

## 2022-03-01 DIAGNOSIS — F339 Major depressive disorder, recurrent, unspecified: Secondary | ICD-10-CM

## 2022-03-02 ENCOUNTER — Other Ambulatory Visit: Payer: Self-pay | Admitting: Family Medicine

## 2022-03-02 DIAGNOSIS — I10 Essential (primary) hypertension: Secondary | ICD-10-CM

## 2022-03-20 ENCOUNTER — Encounter: Payer: Self-pay | Admitting: Family Medicine

## 2022-03-20 ENCOUNTER — Ambulatory Visit (INDEPENDENT_AMBULATORY_CARE_PROVIDER_SITE_OTHER): Payer: 59 | Admitting: Family Medicine

## 2022-03-20 VITALS — BP 160/106 | HR 80 | Temp 99.5°F | Wt 261.4 lb

## 2022-03-20 DIAGNOSIS — R42 Dizziness and giddiness: Secondary | ICD-10-CM

## 2022-03-20 DIAGNOSIS — F1090 Alcohol use, unspecified, uncomplicated: Secondary | ICD-10-CM

## 2022-03-20 DIAGNOSIS — I10 Essential (primary) hypertension: Secondary | ICD-10-CM

## 2022-03-20 LAB — POCT URINALYSIS DIPSTICK
Bilirubin, UA: POSITIVE
Blood, UA: NEGATIVE
Glucose, UA: NEGATIVE
Ketones, UA: NEGATIVE
Leukocytes, UA: NEGATIVE
Nitrite, UA: NEGATIVE
Protein, UA: POSITIVE — AB
Urobilinogen, UA: 0.2 E.U./dL
pH, UA: 6 (ref 5.0–8.0)

## 2022-03-20 MED ORDER — NALTREXONE HCL 50 MG PO TABS: 50.0000 mg | ORAL_TABLET | Freq: Every day | ORAL | 3 refills | Status: DC

## 2022-03-20 NOTE — Patient Instructions (Signed)
It appears that you are dehydrated based on your urine results.  It is important that you are drinking at least 5-6 16.9 oz bottles of water per day if not more if you are outside working or in the heat sweating.  It is also important that you decrease your sodium intake.  A low-sodium diet is 2000-2500 mg of salt per day.

## 2022-03-20 NOTE — Progress Notes (Signed)
Subjective:    Patient ID: Andrew Gaines, male    DOB: September 13, 1965, 56 y.o.   MRN: [id number]  Chief Complaint  Patient presents with   Dizziness    Happened on Saturday. Was out all day and drink plenty of water. Felt dizzy after getting up from sitting. Felt "woozy yesterday and this morning". Also feeling lightheaded    HPI Patient was seen today for acute concern.  Pt endorses dizziness with standing.  Symptoms started Saturday after participating in a charity car ride.  Pt states he had a hot dog, fires, and a milkshake that day.   Pt drank water, liquid iv, and powerade.  States BP at home was 124/88.  Pt ate chicken wings with a dry rub on Sunday.  Stayed in bed after that.  Pt did not take BP meds this morning, took it about 30 minutes prior to appointment.  Patient states he is typically consistent taking bp medication.  At end of visit when receiving AVS, pt inquires about use of naltrexone for alcohol cessation.  Drinking 5-6 drinks or more some days.  Does not drink every day.  States last drink was Friday and he feels fine.  States his sister and a friend have commented on his drinking.  Pt feels annoyed by this as he is not drinking the amount they think he is.  Pt feels guilty about his drinking and wants to cut down.  Currently going through a divorce.  Pt is in counseling, was wkly, now monthly.  Denies opioid use.  Past Medical History:  Diagnosis Date   Cancer ([place]) 06/2014   prostate cancer    Complication of anesthesia    n&v   Diabetes mellitus without complication (HCC)    DVT (deep venous thrombosis) (HCC)    and PE after knee surgery "years ago"   GERD (gastroesophageal reflux disease)    Hiatal hernia    PONV (postoperative nausea and vomiting)    Sleep apnea    scheduled to get CPAP    Unspecified essential hypertension    Unspecified sinusitis (chronic)     Allergies  Allergen Reactions   Gabapentin Other (See Comments)    Headache and dizziness      ROS General: Denies fever, chills, night sweats, changes in weight, changes in appetite  +dizziness HEENT: Denies headaches, ear pain, changes in vision, rhinorrhea, sore throat CV: Denies CP, palpitations, SOB, orthopnea Pulm: Denies SOB, cough, wheezing GI: Denies abdominal pain, nausea, vomiting, diarrhea, constipation GU: Denies dysuria, hematuria, frequency Msk: Denies muscle cramps, joint pains Neuro: Denies weakness, numbness, tingling Skin: Denies rashes, bruising Psych: Denies depression, anxiety, hallucinations      Objective:    Blood pressure (!) 164/110, pulse 80, temperature 99.5 F (37.5 C), temperature source Oral, weight 261 lb 6.4 oz (118.6 kg), SpO2 95 %.  Gen. Pleasant, well-nourished, in no distress, normal affect   HEENT: [place]/AT, face symmetric, conjunctiva clear, no scleral icterus, PERRLA, EOMI, no nystagmus, nares patent without drainage, TMs normal b/l. Neck: No JVD, no carotid bruits Lungs: no accessory muscle use, CTAB, no wheezes or rales Cardiovascular: RRR, no m/r/g, no peripheral edema Musculoskeletal: No deformities, no cyanosis or clubbing, normal tone Neuro:  A&Ox3, CN II-XII intact, normal gait Skin:  Warm, no lesions/ rash   Wt Readings from Last 3 Encounters:  03/20/22 261 lb 6.4 oz (118.6 kg)  09/05/21 262 lb 12.8 oz (119.2 kg)  02/11/21 258 lb 6.4 oz (117.2 kg)    Lab  Results  Component Value Date   WBC 5.2 02/27/2020   HGB 13.6 02/27/2020   HCT 41.0 02/27/2020   PLT 171.0 02/27/2020   GLUCOSE 148 (H) 09/05/2021   CHOL 163 09/05/2021   TRIG 106.0 09/05/2021   HDL 61.40 09/05/2021   LDLDIRECT 138.0 01/31/2010   LDLCALC 81 09/05/2021   ALT 22 02/27/2020   AST 19 02/27/2020   NA 138 09/05/2021   K 4.3 09/05/2021   CL 103 09/05/2021   CREATININE 0.89 09/05/2021   BUN 15 09/05/2021   CO2 27 09/05/2021   TSH 1.33 09/05/2021   PSA 5.56 (H) 02/10/2014   INR 2.0 05/09/2010   HGBA1C 7.1 (H) 09/05/2021     Assessment/Plan:  Dizziness  -Symptoms likely 2/2 dehydration.  Also consider HTN -UA with SG 1.030 -Orthostatic vital signs negative -given handout - Plan: POCT urinalysis dipstick  Essential hypertension  -Elevated likely as patient reports taking meds prior to appointment -Typically controlled at home per patient -Continue losartan 50 mg daily -Discussed importance of lifestyle modifications including decreasing sodium intake and increasing p.o. intake of water and fluids -Importance of CPAP compliance discussed - Plan: CMP  Alcohol use disorder -CAGE questionnaire +3/4: patient interested in cutting down/quitting, annoyed by ppl questioning EtOH intake, feeling guilty.  Denies needing any eye opener. -drinking 5-6 drinks or more at a time several days per wk. -discussed r/b/a of naltrexone. - Plan: naltrexone (DEPADE) 50 MG tablet, CMP  F/u in 2-4 wks  Grier Mitts, MD

## 2022-03-22 DIAGNOSIS — G4733 Obstructive sleep apnea (adult) (pediatric): Secondary | ICD-10-CM | POA: Diagnosis not present

## 2022-03-22 IMAGING — MR MR KNEE*L* W/O CM
7 series · 37 of 40 positions shown · non-contrast
Comparison: None.

CLINICAL DATA: Generalized left knee pain. History of previous knee
surgery

EXAM:
MRI OF THE LEFT KNEE WITHOUT CONTRAST
TECHNIQUE: Multiplanar, multisequence MR imaging of the knee was performed. No
intravenous contrast was administered.

[Series 6: T2 fat-sat · axial · left · 4.0mm · 0.50mm/px · z∈[-100,+54]mm · 7 of 36 slices shown (1 of 3)]
[im 1/36]
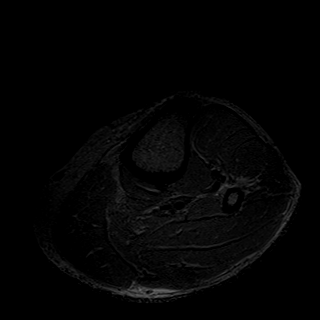
[im 6/36]
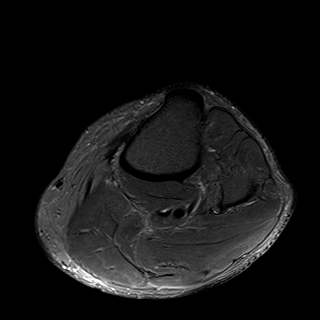
[im 12/36]
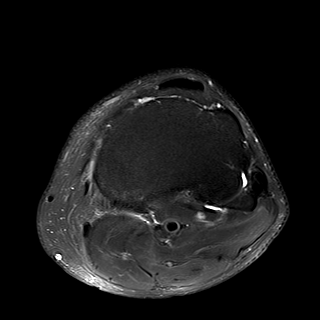
[im 18/36]
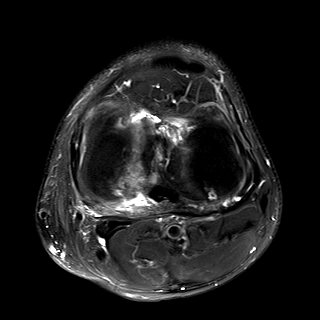
[im 24/36]
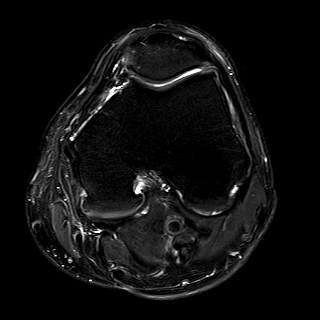
[im 30/36]
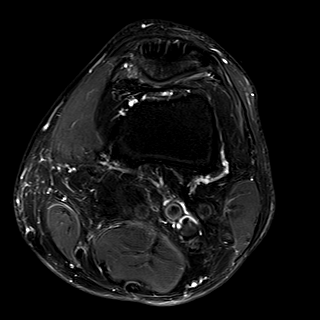
[im 36/36]
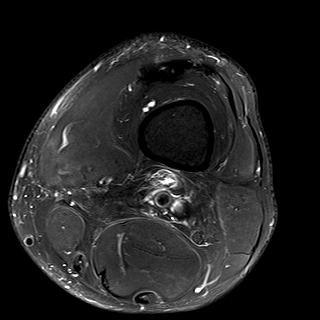

[Series 7: T2 fat-sat · coronal · left · 4.0mm · 0.39mm/px · 6 of 30 slices shown (2 of 3)]
[im 1/30]
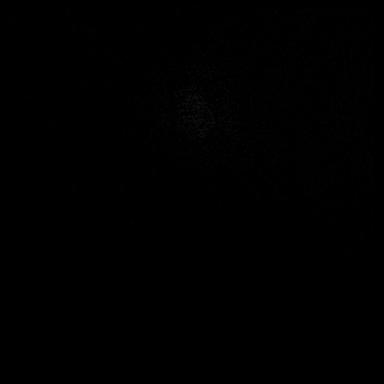
[im 6/30]
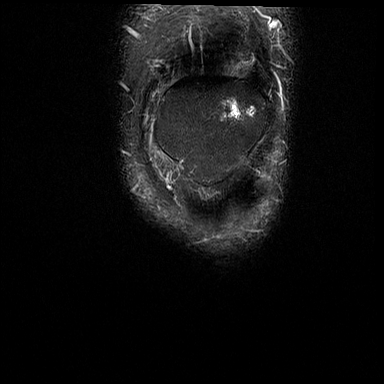
[im 12/30]
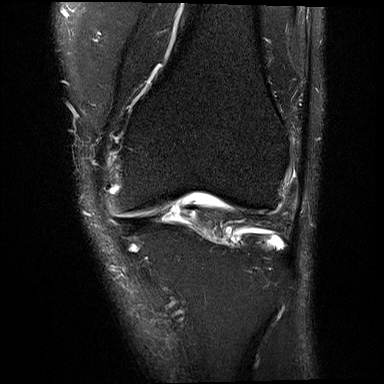
[im 18/30]
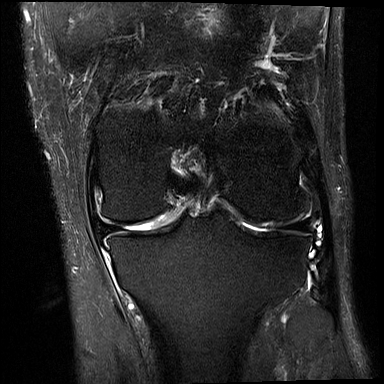
[im 24/30]
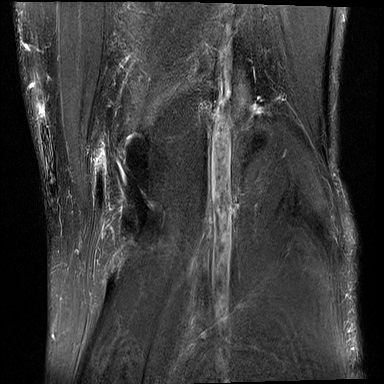
[im 30/30]
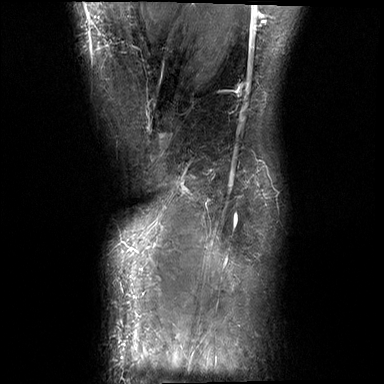

[Series 8: T1 · coronal · left · 4.0mm · 0.39mm/px · 3 of 30 slices shown]
[im 1/30]
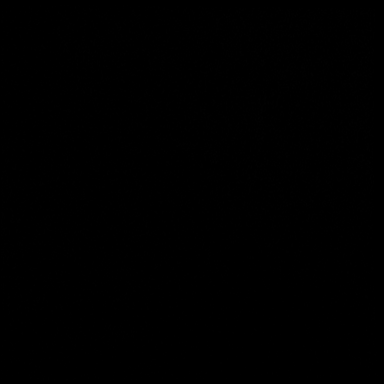
[im 6/30]
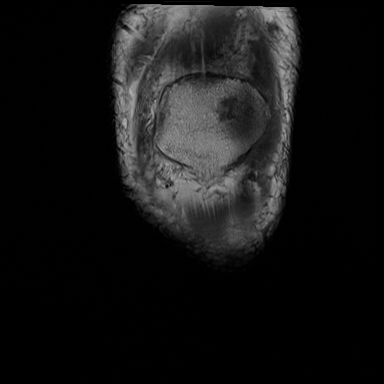
[im 12/30]
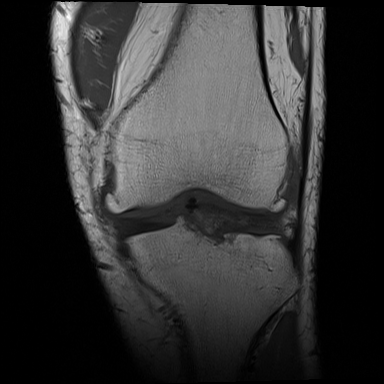

[Series 9: PD fat-sat · coronal · left · 3.0mm · 0.47mm/px · 6 of 28 slices shown (1 of 2)]
[im 1/28]
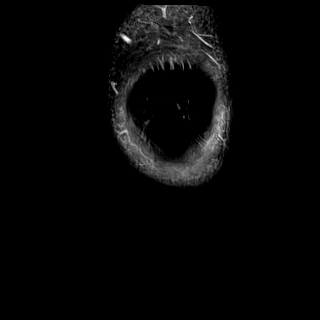
[im 6/28]
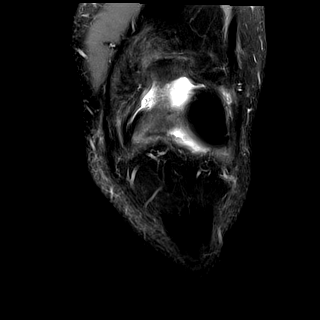
[im 11/28]
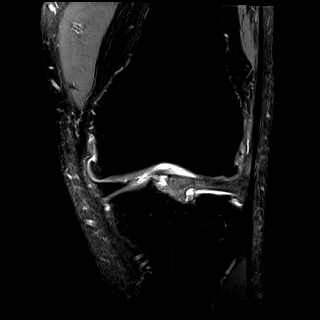
[im 17/28]
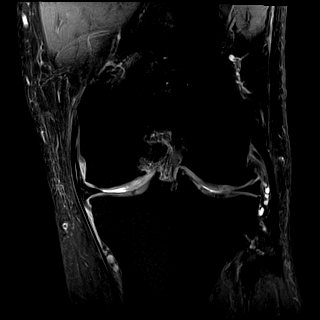
[im 22/28]
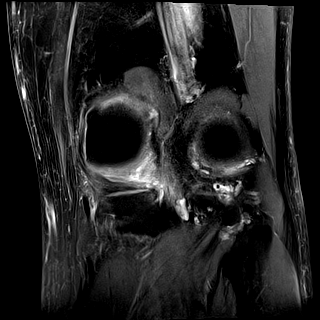
[im 28/28]
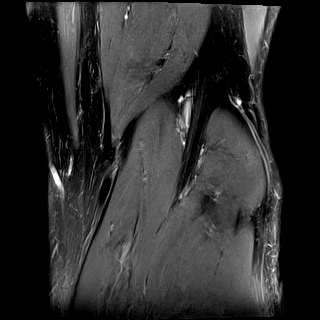

[Series 10: PD fat-sat · sagittal · left · 3.5mm · 0.39mm/px · 5 of 27 slices shown (2 of 2)]
[im 1/27]
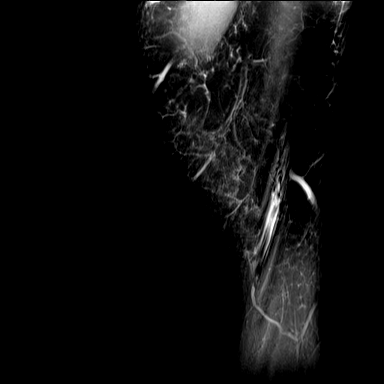
[im 7/27]
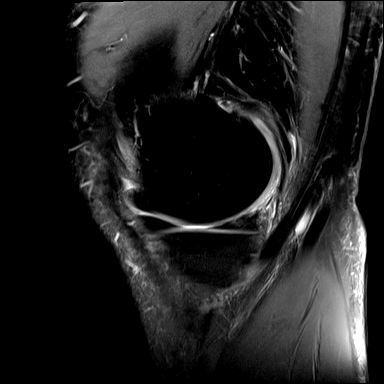
[im 14/27]
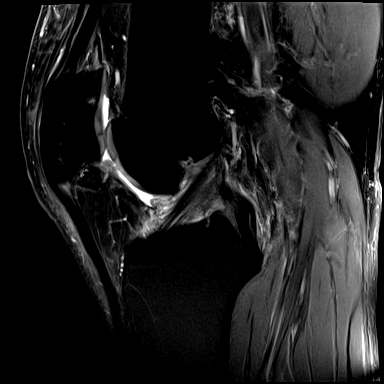
[im 20/27]
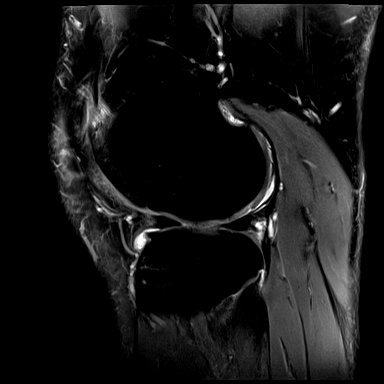
[im 27/27]
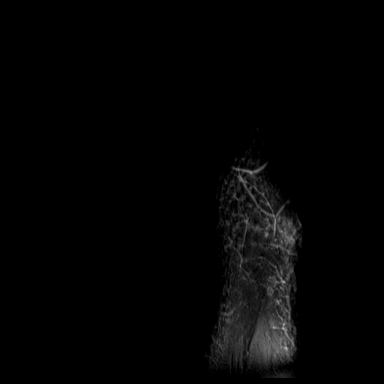

[Series 11: T2 fat-sat · sagittal · left · 3.5mm · 0.39mm/px · 6 of 28 slices shown (3 of 3)]
[im 1/28]
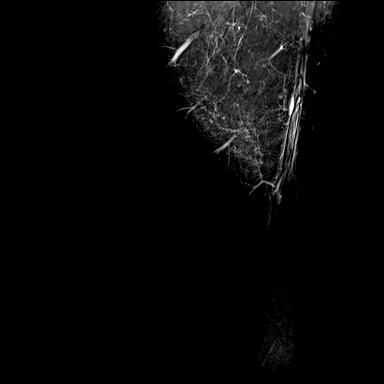
[im 6/28]
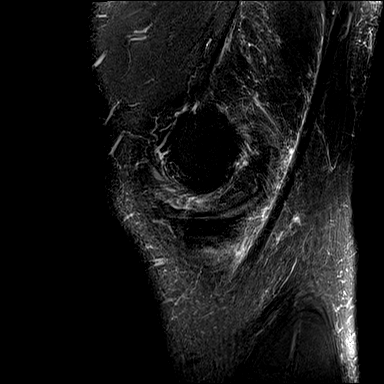
[im 11/28]
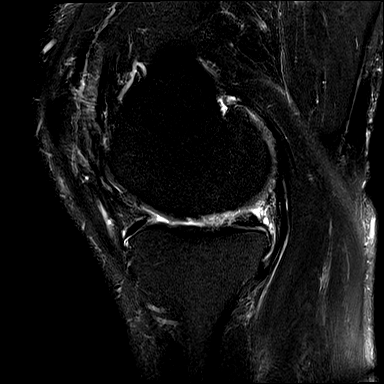
[im 17/28]
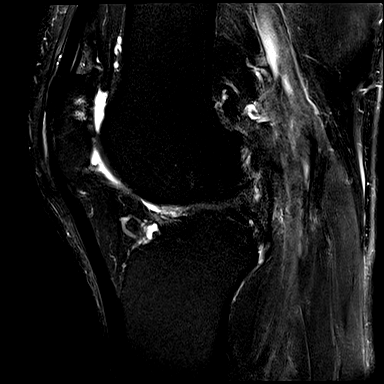
[im 22/28]
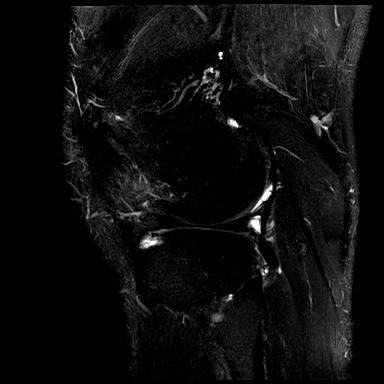
[im 28/28]
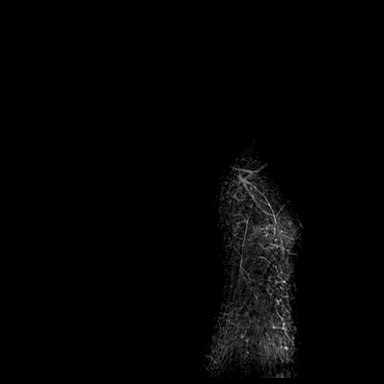

[Series 12: PD · coronal · left · 1.5mm · 0.44mm/px · 4 of 21 slices shown]
[im 1/21]
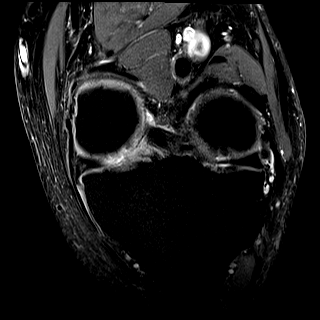
[im 7/21]
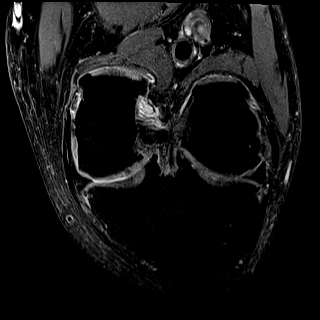
[im 14/21]
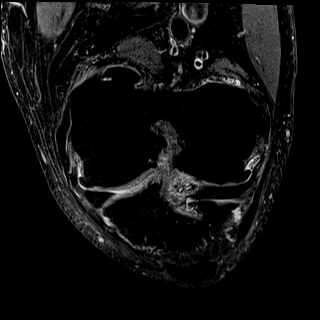
[im 21/21]
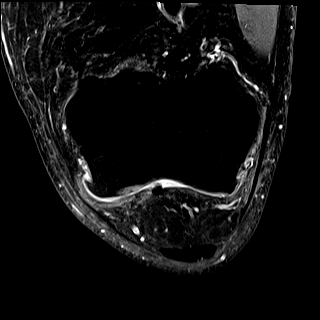

[37 of 40 positions shown; findings below may reference images not displayed]

FINDINGS: MENISCI

Medial meniscus: Intrasubstance degeneration of the medial meniscal
body and posterior horn. Degenerative free edge fraying of the
posterior horn and small irregular tears near the posterior root
attachment (series 10, images 11-12). Meniscus is mildly extruded.

Lateral meniscus:  Intact.

LIGAMENTS

Cruciates:  Intact ACL and PCL.

Collaterals: Medial collateral ligament is intact. Trace MCL bursal
fluid. Lateral collateral ligament complex is intact.

CARTILAGE

Patellofemoral: Chondral surface irregularity of the lateral
patellar facet with underlying subchondral marrow signal changes.
Surface irregularity with near full-thickness fissuring of the
central trochlear groove (series 11, images 14-15).

Medial: Extensive chondral irregularity of the weight-bearing medial
femoral condyle with delamination component along the more lateral
aspect (series 9, images 9-12).

Lateral: Irregular partial-thickness chondral defect of the central
portion of the lateral femoral condyle (series 7, images 10-13).

Joint: Small amount of joint fluid without effusion. Fat pads within
normal limits.

Popliteal Fossa: No significant Baker's cyst. Intact popliteus
tendon.

Extensor Mechanism: Intact quadriceps tendon and patellar tendon.
Mild proximal patellar tendinosis.

Bones: Tricompartmental joint space narrowing and marginal
osteophytosis. Degenerative subchondral marrow signal changes within
the lateral patella. No fracture. No malalignment. No suspicious
bone lesion.

Other: None.
IMPRESSION: 1. Tricompartmental osteoarthritis with cartilage abnormalities as
described above.
2. Intrasubstance degeneration of the medial meniscus with
degenerative tearing of the posterior horn.
3. Trace MCL bursitis.
4. Mild proximal patellar tendinosis.

## 2022-03-23 ENCOUNTER — Other Ambulatory Visit: Payer: Self-pay | Admitting: Family Medicine

## 2022-03-23 DIAGNOSIS — F339 Major depressive disorder, recurrent, unspecified: Secondary | ICD-10-CM

## 2022-03-24 DIAGNOSIS — G4733 Obstructive sleep apnea (adult) (pediatric): Secondary | ICD-10-CM | POA: Diagnosis not present

## 2022-03-29 DIAGNOSIS — R69 Illness, unspecified: Secondary | ICD-10-CM | POA: Diagnosis not present

## 2022-04-03 DIAGNOSIS — S39012A Strain of muscle, fascia and tendon of lower back, initial encounter: Secondary | ICD-10-CM | POA: Diagnosis not present

## 2022-04-03 DIAGNOSIS — M47816 Spondylosis without myelopathy or radiculopathy, lumbar region: Secondary | ICD-10-CM | POA: Diagnosis not present

## 2022-04-17 ENCOUNTER — Encounter: Payer: Self-pay | Admitting: Family Medicine

## 2022-04-17 ENCOUNTER — Ambulatory Visit (INDEPENDENT_AMBULATORY_CARE_PROVIDER_SITE_OTHER): Payer: 59 | Admitting: Family Medicine

## 2022-04-17 VITALS — BP 128/78 | HR 108 | Temp 98.4°F | Wt 260.2 lb

## 2022-04-17 DIAGNOSIS — I10 Essential (primary) hypertension: Secondary | ICD-10-CM

## 2022-04-17 DIAGNOSIS — F1090 Alcohol use, unspecified, uncomplicated: Secondary | ICD-10-CM

## 2022-04-17 DIAGNOSIS — K429 Umbilical hernia without obstruction or gangrene: Secondary | ICD-10-CM

## 2022-04-17 DIAGNOSIS — R69 Illness, unspecified: Secondary | ICD-10-CM | POA: Diagnosis not present

## 2022-04-17 MED ORDER — LOSARTAN POTASSIUM 100 MG PO TABS: 100.0000 mg | ORAL_TABLET | Freq: Every day | ORAL | 3 refills | Status: DC

## 2022-04-17 NOTE — Progress Notes (Signed)
Subjective:    Patient ID: Andrew Gaines, male    DOB: [birth year], 56 y.o.   MRN: [id number]  Chief Complaint  Patient presents with   Follow-up    BP- has lo and readings are elevated.     HPI Patient was seen today for f/u on HTN.  States taking pill and a half of losartan for total dose of 75 mg daily.  BP readings at home elevated: 142/91, 152/98, 139/91, 128/81, 140/101, 156/108, 169/101, 157/103, 167/92, 159/98, 165/95, 155/94, 161/103, 150/97 over the last 3 weeks.  Patient denies headaches, changes in vision, CP.  States trying to eat less salt, but still eating out.  States drinking plenty of water.  Taking naltrexone daily.  States he may drink 3 beers per week.  In the past seen by Dr. Harlow Asa? For umbilical hernia.  Pt states he was advised not to have surgery at that time.  Past Medical History:  Diagnosis Date   Cancer ([place]) 06/2014   prostate cancer    Complication of anesthesia    n&v   Diabetes mellitus without complication (HCC)    DVT (deep venous thrombosis) (HCC)    and PE after knee surgery "years ago"   GERD (gastroesophageal reflux disease)    Hiatal hernia    PONV (postoperative nausea and vomiting)    Sleep apnea    scheduled to get CPAP    Unspecified essential hypertension    Unspecified sinusitis (chronic)     Allergies  Allergen Reactions   Gabapentin Other (See Comments)    Headache and dizziness     ROS General: Denies fever, chills, night sweats, changes in weight, changes in appetite HEENT: Denies headaches, ear pain, changes in vision, rhinorrhea, sore throat CV: Denies CP, palpitations, SOB, orthopnea Pulm: Denies SOB, cough, wheezing GI: Denies abdominal pain, nausea, vomiting, diarrhea, constipation GU: Denies dysuria, hematuria, frequency Msk: Denies muscle cramps, joint pains Neuro: Denies weakness, numbness, tingling Skin: Denies rashes, bruising Psych: Denies depression, anxiety, hallucinations    Objective:    Blood  pressure (!) 142/80, pulse (!) 108, temperature 98.4 F (36.9 C), temperature source Oral, weight 260 lb 3.2 oz (118 kg), SpO2 94 %.  Gen. Pleasant, well-nourished, in no distress, normal affect   HEENT: [place]/AT, face symmetric, conjunctiva clear, no scleral icterus, PERRLA, EOMI, nares patent without drainage Lungs: no accessory muscle use, CTAB, no wheezes or rales Cardiovascular: RRR, no m/r/g, no peripheral edema Abdomen: BS present, soft, reducible umbilical hernia, NT/ND Neuro:  A&Ox3, CN II-XII intact, normal gait Skin:  Warm, no lesions/ rash   Wt Readings from Last 3 Encounters:  04/17/22 260 lb 3.2 oz (118 kg)  03/20/22 261 lb 6.4 oz (118.6 kg)  09/05/21 262 lb 12.8 oz (119.2 kg)    Lab Results  Component Value Date   WBC 5.2 02/27/2020   HGB 13.6 02/27/2020   HCT 41.0 02/27/2020   PLT 171.0 02/27/2020   GLUCOSE 148 (H) 09/05/2021   CHOL 163 09/05/2021   TRIG 106.0 09/05/2021   HDL 61.40 09/05/2021   LDLDIRECT 138.0 01/31/2010   LDLCALC 81 09/05/2021   ALT 22 02/27/2020   AST 19 02/27/2020   NA 138 09/05/2021   K 4.3 09/05/2021   CL 103 09/05/2021   CREATININE 0.89 09/05/2021   BUN 15 09/05/2021   CO2 27 09/05/2021   TSH 1.33 09/05/2021   PSA 5.56 (H) 02/10/2014   INR 2.0 05/09/2010   HGBA1C 7.1 (H) 09/05/2021    Assessment/Plan:  Essential hypertension  -Uncontrolled -Elevated readings at home -We will increase losartan to 100 mg daily -Discussed the importance of lifestyle modifications -Continue checking BP at home and keeping a log to bring to clinic.  Can also bring BP cuff to next visit. - Plan: losartan (COZAAR) 100 MG tablet  Alcohol use disorder -Patient advised to refrain from drinking any alcohol -Continue naltrexone  Umbilical hernia without obstruction and without gangrene -Given strict precautions - Plan: Ambulatory referral to General Surgery  F/u 4-6 weeks, sooner if needed  Grier Mitts, MD

## 2022-04-17 NOTE — Patient Instructions (Signed)
At today's visit we discussed increasing your dose of losartan to 100 mg daily.  A new prescription for this increased dose was sent to your pharmacy.  If you still have 50 mg tabs at home you can take 2 of these daily for a total dose of 100 mg.  It is important that you start eating better, reading labels, and exercising.

## 2022-04-24 DIAGNOSIS — G4733 Obstructive sleep apnea (adult) (pediatric): Secondary | ICD-10-CM | POA: Diagnosis not present

## 2022-05-10 DIAGNOSIS — R69 Illness, unspecified: Secondary | ICD-10-CM | POA: Diagnosis not present

## 2022-05-23 ENCOUNTER — Other Ambulatory Visit: Payer: Self-pay | Admitting: Family Medicine

## 2022-05-23 DIAGNOSIS — F339 Major depressive disorder, recurrent, unspecified: Secondary | ICD-10-CM

## 2022-05-29 ENCOUNTER — Ambulatory Visit: Payer: 59 | Admitting: Family Medicine

## 2022-05-31 DIAGNOSIS — R69 Illness, unspecified: Secondary | ICD-10-CM | POA: Diagnosis not present

## 2022-06-05 ENCOUNTER — Ambulatory Visit (INDEPENDENT_AMBULATORY_CARE_PROVIDER_SITE_OTHER): Payer: 59 | Admitting: Family Medicine

## 2022-06-05 ENCOUNTER — Encounter: Payer: Self-pay | Admitting: Family Medicine

## 2022-06-05 VITALS — BP 126/86 | HR 105 | Temp 98.9°F | Wt 264.4 lb

## 2022-06-05 DIAGNOSIS — K429 Umbilical hernia without obstruction or gangrene: Secondary | ICD-10-CM | POA: Diagnosis not present

## 2022-06-05 DIAGNOSIS — I10 Essential (primary) hypertension: Secondary | ICD-10-CM | POA: Diagnosis not present

## 2022-06-05 NOTE — Progress Notes (Signed)
Subjective:    Patient ID: Andrew Gaines, male    DOB: Jan 15, 1966, 56 y.o.   MRN: [id number]  Chief Complaint  Patient presents with   Follow-up    BP    HPI Patient was seen today for follow-up on BP.  Patient taking losartan 100 mg daily.  States bp now 120-130/80s.  Denies headaches, changes in vision, CP, LE edema.  Patient states he has yet to hear about referral from [place] surgery regarding umbilical hernia.  Per chart review referral was placed 04/17/2022 and other test.  Past Medical History:  Diagnosis Date   Cancer ([place]) 06/2014   prostate cancer    Complication of anesthesia    n&v   Diabetes mellitus without complication (HCC)    DVT (deep venous thrombosis) ([place])    and PE after knee surgery "years ago"   GERD (gastroesophageal reflux disease)    Hiatal hernia    PONV (postoperative nausea and vomiting)    Sleep apnea    scheduled to get CPAP    Unspecified essential hypertension    Unspecified sinusitis (chronic)     Allergies  Allergen Reactions   Gabapentin Other (See Comments)    Headache and dizziness     ROS General: Denies fever, chills, night sweats, changes in weight, changes in appetite HEENT: Denies headaches, ear pain, changes in vision, rhinorrhea, sore throat CV: Denies CP, palpitations, SOB, orthopnea Pulm: Denies SOB, cough, wheezing GI: Denies abdominal pain, nausea, vomiting, diarrhea, constipation GU: Denies dysuria, hematuria, frequency Msk: Denies muscle cramps, joint pains Neuro: Denies weakness, numbness, tingling Skin: Denies rashes, bruising Psych: Denies depression, anxiety, hallucinations     Objective:    Blood pressure 126/86, pulse (!) 105, temperature 98.9 F (37.2 C), temperature source Oral, weight 264 lb 6.4 oz (119.9 kg), SpO2 99 %.  Gen. Pleasant, well-nourished, in no distress, normal affect   HEENT: [place]/AT, face symmetric, conjunctiva clear, no scleral icterus, PERRLA, EOMI, nares patent without  drainage Lungs: no accessory muscle use, CTAB, no wheezes or rales Cardiovascular: RRR, no m/r/g, no peripheral edema Neuro:  A&Ox3, CN II-XII intact, normal gait Skin:  Warm, no lesions/ rash   Wt Readings from Last 3 Encounters:  06/05/22 264 lb 6.4 oz (119.9 kg)  04/17/22 260 lb 3.2 oz (118 kg)  03/20/22 261 lb 6.4 oz (118.6 kg)    Lab Results  Component Value Date   WBC 5.2 02/27/2020   HGB 13.6 02/27/2020   HCT 41.0 02/27/2020   PLT 171.0 02/27/2020   GLUCOSE 148 (H) 09/05/2021   CHOL 163 09/05/2021   TRIG 106.0 09/05/2021   HDL 61.40 09/05/2021   LDLDIRECT 138.0 01/31/2010   LDLCALC 81 09/05/2021   ALT 22 02/27/2020   AST 19 02/27/2020   NA 138 09/05/2021   K 4.3 09/05/2021   CL 103 09/05/2021   CREATININE 0.89 09/05/2021   BUN 15 09/05/2021   CO2 27 09/05/2021   TSH 1.33 09/05/2021   PSA 5.56 (H) 02/10/2014   INR 2.0 05/09/2010   HGBA1C 7.1 (H) 09/05/2021    Assessment/Plan:  Essential hypertension -Controlled -Continue losartan 100 mg daily -Continue lifestyle modifications -Patient encouraged to continue checking BP at home and keeping a log to bring with him to clinic  Umbilical hernia without obstruction and without gangrene -Referral placed to [facility] surg, [facility] on 04/17/2022.  Patient encouraged to contact their office in regards to scheduling as no additional notes available in chart.  F/u in 4-6 months  as needed  Grier Mitts, MD

## 2022-07-05 DIAGNOSIS — R69 Illness, unspecified: Secondary | ICD-10-CM | POA: Diagnosis not present

## 2022-07-11 ENCOUNTER — Other Ambulatory Visit: Payer: Self-pay | Admitting: Family Medicine

## 2022-07-11 DIAGNOSIS — F339 Major depressive disorder, recurrent, unspecified: Secondary | ICD-10-CM

## 2022-07-15 ENCOUNTER — Other Ambulatory Visit: Payer: Self-pay | Admitting: Family Medicine

## 2022-07-15 DIAGNOSIS — F1090 Alcohol use, unspecified, uncomplicated: Secondary | ICD-10-CM

## 2022-07-20 DIAGNOSIS — R69 Illness, unspecified: Secondary | ICD-10-CM | POA: Diagnosis not present

## 2022-08-02 DIAGNOSIS — G4733 Obstructive sleep apnea (adult) (pediatric): Secondary | ICD-10-CM | POA: Diagnosis not present

## 2022-08-08 ENCOUNTER — Ambulatory Visit: Payer: 59 | Admitting: Internal Medicine

## 2022-08-08 ENCOUNTER — Encounter: Payer: Self-pay | Admitting: Internal Medicine

## 2022-08-08 VITALS — BP 130/74 | HR 70 | Temp 98.9°F | Ht 72.0 in | Wt 263.0 lb

## 2022-08-08 DIAGNOSIS — K2100 Gastro-esophageal reflux disease with esophagitis, without bleeding: Secondary | ICD-10-CM | POA: Diagnosis not present

## 2022-08-08 DIAGNOSIS — R059 Cough, unspecified: Secondary | ICD-10-CM | POA: Diagnosis not present

## 2022-08-08 DIAGNOSIS — I10 Essential (primary) hypertension: Secondary | ICD-10-CM | POA: Diagnosis not present

## 2022-08-08 LAB — POC SOFIA SARS ANTIGEN FIA: SARS Coronavirus 2 Ag: NEGATIVE

## 2022-08-08 LAB — POCT RESPIRATORY SYNCYTIAL VIRUS: RSV Rapid Ag: NEGATIVE

## 2022-08-08 MED ORDER — HYDROCODONE BIT-HOMATROP MBR 5-1.5 MG/5ML PO SOLN: 5.0000 mL | Freq: Four times a day (QID) | ORAL | 0 refills | Status: AC | PRN

## 2022-08-08 MED ORDER — LEVOFLOXACIN 500 MG PO TABS: 500.0000 mg | ORAL_TABLET | Freq: Every day | ORAL | 0 refills | Status: AC

## 2022-08-08 NOTE — Assessment & Plan Note (Signed)
Overall stable, seems unlikely to be related to the cough, cont current tx - prilosec 40 mg qd

## 2022-08-08 NOTE — Assessment & Plan Note (Signed)
BP Readings from Last 3 Encounters:  08/08/22 130/74  06/05/22 126/86  04/17/22 128/78   Stable, pt to continue medical treatment losartan 100 mg qd

## 2022-08-08 NOTE — Progress Notes (Signed)
Patient ID: Andrew Gaines, male   DOB: 11/26/1965, 56 y.o.   MRN: [id number]        Chief Complaint: follow up mild prod cough x 2 wks       HPI:  Andrew Gaines is a 56 y.o. male here with unusual for him c/o mild prod cough x 2 wks, gradually worsening such that last night could not sleep well, so here today.  Pt denies chest pain, increased sob or doe, wheezing, orthopnea, PND, increased LE swelling, palpitations, dizziness or syncope.   Pt denies polydipsia, polyuria, or new focal neuro s/s.    Pt denies fever, wt loss, night sweats, loss of appetite, or other constitutional symptoms  Denies worsening reflux, abd pain, dysphagia, n/v, bowel change or blood.        Wt Readings from Last 3 Encounters:  08/08/22 263 lb (119.3 kg)  06/05/22 264 lb 6.4 oz (119.9 kg)  04/17/22 260 lb 3.2 oz (118 kg)   BP Readings from Last 3 Encounters:  08/08/22 130/74  06/05/22 126/86  04/17/22 128/78         Past Medical History:  Diagnosis Date   Cancer ([place]) 06/2014   prostate cancer    Complication of anesthesia    n&v   Diabetes mellitus without complication (HCC)    DVT (deep venous thrombosis) (HCC)    and PE after knee surgery "years ago"   GERD (gastroesophageal reflux disease)    Hiatal hernia    PONV (postoperative nausea and vomiting)    Sleep apnea    scheduled to get CPAP    Unspecified essential hypertension    Unspecified sinusitis (chronic)    Past Surgical History:  Procedure Laterality Date   JOINT REPLACEMENT     bilateral knee    KNEE ARTHROSCOPY     bilateral    LYMPHADENECTOMY Bilateral 07/20/2014   Procedure: LYMPHADENECTOMY;  Surgeon: Raynelle Bring, MD;  Location: [facility];  Service: Urology;  Laterality: Bilateral;   ROBOT ASSISTED LAPAROSCOPIC RADICAL PROSTATECTOMY N/A 07/20/2014   Procedure: ROBOTIC ASSISTED LAPAROSCOPIC RADICAL PROSTATECTOMY LEVEL 2;  Surgeon: Raynelle Bring, MD;  Location: [facility];  Service: Urology;  Laterality: N/A;   SHOULDER ARTHROSCOPY      bilateral     reports that he has never smoked. He has never used smokeless tobacco. He reports current alcohol use. He reports that he does not use drugs. family history includes Breast cancer in his maternal grandmother; Colon cancer (age of onset: 59) in his paternal grandmother. No Active Allergies Current Outpatient Medications on File Prior to Visit  Medication Sig Dispense Refill   aspirin EC 81 MG tablet Take 81 mg by mouth daily.     atorvastatin (LIPITOR) 20 MG tablet TAKE 1 TABLET BY MOUTH EVERY DAY 90 tablet 3   escitalopram (LEXAPRO) 20 MG tablet TAKE 1 TABLET BY MOUTH EVERY DAY 90 tablet 0   losartan (COZAAR) 100 MG tablet Take 1 tablet (100 mg total) by mouth daily. 90 tablet 3   meloxicam (MOBIC) 15 MG tablet Take 15 mg by mouth as needed.     metFORMIN (GLUCOPHAGE) 500 MG tablet TAKE 1 TABLET BY MOUTH TWICE A DAY WITH MEALS 180 tablet 3   naltrexone (DEPADE) 50 MG tablet TAKE 1 TABLET BY MOUTH EVERY DAY 30 tablet 3   omeprazole (PRILOSEC) 40 MG capsule TAKE 1 CAPSULE BY MOUTH EVERYDAY AT BEDTIME 90 capsule 0   sildenafil (REVATIO) 20 MG tablet Take 20 mg by mouth  as needed.     Current Facility-Administered Medications on File Prior to Visit  Medication Dose Route Frequency Provider Last Rate Last Admin   0.9 %  sodium chloride infusion  500 mL Intravenous Continuous Armbruster, Carlota Raspberry, MD            ROS:  All others reviewed and negative.  Objective        PE:  BP 130/74 (BP Location: Left Arm, Patient Position: Sitting, Cuff Size: Large)   Pulse 70   Temp 98.9 F (37.2 C) (Oral)   Ht 6' (1.829 m)   Wt 263 lb (119.3 kg)   SpO2 92%   BMI 35.67 kg/m                 Constitutional: Pt appears in NAD, mild ill               HENT: Head: NCAT.                Right Ear: External ear normal.                 Left Ear: External ear normal. Bilat tm's with mild erythema.  Max sinus areas non tender.  Pharynx with mild erythema, no exudate                 Eyes: . Pupils  are equal, round, and reactive to light. Conjunctivae and EOM are normal               Nose: without d/c or deformity               Neck: Neck supple. Gross normal ROM               Cardiovascular: Normal rate and regular rhythm.                 Pulmonary/Chest: Effort normal and breath sounds decreased without rales or wheezing.                              Neurological: Pt is alert. At baseline orientation, motor grossly intact               Skin: Skin is warm. No rashes, no other new lesions, LE edema - none               Psychiatric: Pt behavior is normal without agitation   Micro: none  Cardiac tracings I have personally interpreted today:  none  Pertinent Radiological findings (summarize): none   Lab Results  Component Value Date   WBC 5.2 02/27/2020   HGB 13.6 02/27/2020   HCT 41.0 02/27/2020   PLT 171.0 02/27/2020   GLUCOSE 148 (H) 09/05/2021   CHOL 163 09/05/2021   TRIG 106.0 09/05/2021   HDL 61.40 09/05/2021   LDLDIRECT 138.0 01/31/2010   LDLCALC 81 09/05/2021   ALT 22 02/27/2020   AST 19 02/27/2020   NA 138 09/05/2021   K 4.3 09/05/2021   CL 103 09/05/2021   CREATININE 0.89 09/05/2021   BUN 15 09/05/2021   CO2 27 09/05/2021   TSH 1.33 09/05/2021   PSA 5.56 (H) 02/10/2014   INR 2.0 05/09/2010   HGBA1C 7.1 (H) 09/05/2021   POCT today - COVID neg, RSV neg  Assessment/Plan:  Andrew Gaines is a 56 y.o. White or Caucasian [1] male with  has a past medical history of Cancer ([place]) ([id number]), Complication of anesthesia,  Diabetes mellitus without complication ([place]), DVT (deep venous thrombosis) ([place]), GERD (gastroesophageal reflux disease), Hiatal hernia, PONV (postoperative nausea and vomiting), Sleep apnea, Unspecified essential hypertension, and Unspecified sinusitis (chronic).  Cough Mild to mod, c/w bronchitis vs pna, covid and RSV neg, for antibx course, cough med prn, for CXR, to f/u any worsening symptoms or concerns   Essential hypertension BP Readings  from Last 3 Encounters:  08/08/22 130/74  06/05/22 126/86  04/17/22 128/78   Stable, pt to continue medical treatment losartan 100 mg qd   Gastro-esophageal reflux disease with esophagitis Overall stable, seems unlikely to be related to the cough, cont current tx - prilosec 40 mg qd  Followup: Return if symptoms worsen or fail to improve.  Cathlean Cower, MD 08/08/2022 10:36 AM [facility]

## 2022-08-08 NOTE — Patient Instructions (Signed)
Please take all new medication as prescribed -  the antibiotic, and the cough medicine  Please continue all other medications as before, and refills have been done if requested.  Please have the pharmacy call with any other refills you may need.  Please keep your appointments with your specialists as you may have planned  Please go to the XRAY Department in the first floor for the x-ray testing  You will be contacted by phone if any changes need to be made immediately.  Otherwise, you will receive a letter about your results with an explanation, but please check with MyChart first.  Please remember to sign up for MyChart if you have not done so, as this will be important to you in the future with finding out test results, communicating by private email, and scheduling acute appointments online when needed.

## 2022-08-08 NOTE — Assessment & Plan Note (Signed)
Mild to mod, c/w bronchitis vs pna, covid and RSV neg, for antibx course, cough med prn, for CXR, to f/u any worsening symptoms or concerns

## 2022-08-12 ENCOUNTER — Other Ambulatory Visit: Payer: Self-pay | Admitting: Family Medicine

## 2022-08-18 ENCOUNTER — Ambulatory Visit
Admission: RE | Admit: 2022-08-18 | Discharge: 2022-08-18 | Disposition: A | Discharge status: Home / Self Care | Payer: 59 | Source: Ambulatory Visit | Attending: Internal Medicine | Admitting: Internal Medicine

## 2022-08-18 DIAGNOSIS — R059 Cough, unspecified: Secondary | ICD-10-CM

## 2022-09-02 DIAGNOSIS — G4733 Obstructive sleep apnea (adult) (pediatric): Secondary | ICD-10-CM | POA: Diagnosis not present

## 2022-09-04 ENCOUNTER — Other Ambulatory Visit: Payer: Self-pay | Admitting: Family Medicine

## 2022-09-04 ENCOUNTER — Other Ambulatory Visit: Payer: Self-pay

## 2022-09-04 ENCOUNTER — Telehealth: Payer: Self-pay | Admitting: Family Medicine

## 2022-09-04 DIAGNOSIS — R69 Illness, unspecified: Secondary | ICD-10-CM | POA: Diagnosis not present

## 2022-09-04 DIAGNOSIS — F1090 Alcohol use, unspecified, uncomplicated: Secondary | ICD-10-CM

## 2022-09-04 DIAGNOSIS — F339 Major depressive disorder, recurrent, unspecified: Secondary | ICD-10-CM

## 2022-09-04 MED ORDER — NALTREXONE HCL 50 MG PO TABS: 50.0000 mg | ORAL_TABLET | Freq: Every day | ORAL | 3 refills | Status: DC

## 2022-09-04 NOTE — Telephone Encounter (Signed)
Patient requesting prescription naltrexone (DEPADE) 50 MG tablet be forwarded to  [facility], [place] Phone: [phone]  Fax: [phone]

## 2022-09-04 NOTE — Telephone Encounter (Signed)
Rx sent to pharmacy below. 

## 2022-10-03 DIAGNOSIS — G4733 Obstructive sleep apnea (adult) (pediatric): Secondary | ICD-10-CM | POA: Diagnosis not present

## 2022-10-05 ENCOUNTER — Other Ambulatory Visit: Payer: Self-pay | Admitting: Family Medicine

## 2022-10-05 DIAGNOSIS — F339 Major depressive disorder, recurrent, unspecified: Secondary | ICD-10-CM

## 2022-10-10 ENCOUNTER — Telehealth: Payer: Self-pay | Admitting: Family Medicine

## 2022-10-10 NOTE — Telephone Encounter (Signed)
Robin from [facility] on [address] called stating patient needs a refill on his escitalopram '20mg'$ . A good callback number is [phone].

## 2022-10-13 NOTE — Telephone Encounter (Signed)
Refill was sent 10/11/2022.

## 2022-10-18 DIAGNOSIS — F4321 Adjustment disorder with depressed mood: Secondary | ICD-10-CM | POA: Diagnosis not present

## 2022-11-01 DIAGNOSIS — G4733 Obstructive sleep apnea (adult) (pediatric): Secondary | ICD-10-CM | POA: Diagnosis not present

## 2022-11-09 ENCOUNTER — Other Ambulatory Visit: Payer: Self-pay | Admitting: Family Medicine

## 2022-12-02 DIAGNOSIS — G4733 Obstructive sleep apnea (adult) (pediatric): Secondary | ICD-10-CM | POA: Diagnosis not present

## 2022-12-04 ENCOUNTER — Other Ambulatory Visit: Payer: Self-pay | Admitting: Family Medicine

## 2022-12-08 ENCOUNTER — Other Ambulatory Visit: Payer: Self-pay | Admitting: Family Medicine

## 2022-12-11 DIAGNOSIS — M79642 Pain in left hand: Secondary | ICD-10-CM | POA: Diagnosis not present

## 2022-12-13 DIAGNOSIS — F4321 Adjustment disorder with depressed mood: Secondary | ICD-10-CM | POA: Diagnosis not present

## 2022-12-27 ENCOUNTER — Encounter: Payer: Self-pay | Admitting: Family Medicine

## 2022-12-27 LAB — HM DIABETES EYE EXAM

## 2023-01-01 DIAGNOSIS — G4733 Obstructive sleep apnea (adult) (pediatric): Secondary | ICD-10-CM | POA: Diagnosis not present

## 2023-01-06 ENCOUNTER — Other Ambulatory Visit: Payer: Self-pay | Admitting: Family Medicine

## 2023-01-23 ENCOUNTER — Other Ambulatory Visit: Payer: Self-pay | Admitting: Family Medicine

## 2023-01-23 DIAGNOSIS — F1090 Alcohol use, unspecified, uncomplicated: Secondary | ICD-10-CM

## 2023-01-29 NOTE — Telephone Encounter (Signed)
Pt calling to check on progress of this refill. Patient also checking on referral [phone]. Says he never heard from provider. Please advise

## 2023-02-02 ENCOUNTER — Ambulatory Visit (INDEPENDENT_AMBULATORY_CARE_PROVIDER_SITE_OTHER): Payer: 59 | Admitting: Family Medicine

## 2023-02-02 ENCOUNTER — Encounter: Payer: Self-pay | Admitting: Family Medicine

## 2023-02-02 VITALS — BP 122/82 | HR 70 | Temp 98.9°F | Ht 71.0 in | Wt 243.4 lb

## 2023-02-02 DIAGNOSIS — F339 Major depressive disorder, recurrent, unspecified: Secondary | ICD-10-CM | POA: Diagnosis not present

## 2023-02-02 DIAGNOSIS — Z0000 Encounter for general adult medical examination without abnormal findings: Secondary | ICD-10-CM | POA: Diagnosis not present

## 2023-02-02 DIAGNOSIS — Z8546 Personal history of malignant neoplasm of prostate: Secondary | ICD-10-CM | POA: Diagnosis not present

## 2023-02-02 DIAGNOSIS — Z7984 Long term (current) use of oral hypoglycemic drugs: Secondary | ICD-10-CM

## 2023-02-02 DIAGNOSIS — E782 Mixed hyperlipidemia: Secondary | ICD-10-CM

## 2023-02-02 DIAGNOSIS — K429 Umbilical hernia without obstruction or gangrene: Secondary | ICD-10-CM

## 2023-02-02 DIAGNOSIS — E119 Type 2 diabetes mellitus without complications: Secondary | ICD-10-CM

## 2023-02-02 DIAGNOSIS — I10 Essential (primary) hypertension: Secondary | ICD-10-CM

## 2023-02-02 LAB — CBC WITH DIFFERENTIAL/PLATELET
Basophils Absolute: 0.1 10*3/uL (ref 0.0–0.1)
Basophils Relative: 1.3 % (ref 0.0–3.0)
Eosinophils Absolute: 0.4 10*3/uL (ref 0.0–0.7)
Eosinophils Relative: 6.2 % — ABNORMAL HIGH (ref 0.0–5.0)
HCT: 40.4 % (ref 39.0–52.0)
Hemoglobin: 13.1 g/dL (ref 13.0–17.0)
Lymphocytes Relative: 24.9 % (ref 12.0–46.0)
Lymphs Abs: 1.4 10*3/uL (ref 0.7–4.0)
MCHC: 32.4 g/dL (ref 30.0–36.0)
MCV: 85.4 fl (ref 78.0–100.0)
Monocytes Absolute: 0.6 10*3/uL (ref 0.1–1.0)
Monocytes Relative: 9.7 % (ref 3.0–12.0)
Neutro Abs: 3.3 10*3/uL (ref 1.4–7.7)
Neutrophils Relative %: 57.9 % (ref 43.0–77.0)
Platelets: 222 10*3/uL (ref 150.0–400.0)
RBC: 4.73 Mil/uL (ref 4.22–5.81)
RDW: 17 % — ABNORMAL HIGH (ref 11.5–15.5)
WBC: 5.6 10*3/uL (ref 4.0–10.5)

## 2023-02-02 LAB — LIPID PANEL
Cholesterol: 175 mg/dL (ref 0–200)
HDL: 70.7 mg/dL (ref >39.00)
LDL Cholesterol: 89 mg/dL (ref 0–99)
NonHDL: 104.49
Total CHOL/HDL Ratio: 2
Triglycerides: 75 mg/dL (ref 0.0–149.0)
VLDL: 15 mg/dL (ref 0.0–40.0)

## 2023-02-02 LAB — COMPREHENSIVE METABOLIC PANEL
ALT: 21 U/L (ref 0–53)
AST: 21 U/L (ref 0–37)
Albumin: 4.3 g/dL (ref 3.5–5.2)
Alkaline Phosphatase: 55 U/L (ref 39–117)
BUN: 13 mg/dL (ref 6–23)
CO2: 25 mEq/L (ref 19–32)
Calcium: 9.4 mg/dL (ref 8.4–10.5)
Chloride: 103 mEq/L (ref 96–112)
Creatinine, Ser: 0.96 mg/dL (ref 0.40–1.50)
GFR: 87.08 mL/min (ref >60.00)
Glucose, Bld: 99 mg/dL (ref 70–99)
Potassium: 4.5 mEq/L (ref 3.5–5.1)
Sodium: 137 mEq/L (ref 135–145)
Total Bilirubin: 0.9 mg/dL (ref 0.2–1.2)
Total Protein: 7.3 g/dL (ref 6.0–8.3)

## 2023-02-02 LAB — MICROALBUMIN / CREATININE URINE RATIO
Creatinine,U: 55.5 mg/dL
Microalb Creat Ratio: 1.3 mg/g (ref 0.0–30.0)
Microalb, Ur: <0.7 mg/dL (ref 0.0–1.9)

## 2023-02-02 LAB — TSH: TSH: 1.2 u[IU]/mL (ref 0.35–5.50)

## 2023-02-02 LAB — HEMOGLOBIN A1C: Hgb A1c MFr Bld: 6.4 % (ref 4.6–6.5)

## 2023-02-02 LAB — T4, FREE: Free T4: 0.85 ng/dL (ref 0.60–1.60)

## 2023-02-02 MED ORDER — ESCITALOPRAM OXALATE 20 MG PO TABS: 20.0000 mg | ORAL_TABLET | Freq: Every day | ORAL | 1 refills | Status: DC

## 2023-02-02 MED ORDER — LOSARTAN POTASSIUM 100 MG PO TABS: 100.0000 mg | ORAL_TABLET | Freq: Every day | ORAL | 3 refills | Status: DC

## 2023-02-02 MED ORDER — METFORMIN HCL 500 MG PO TABS: 500.0000 mg | ORAL_TABLET | Freq: Two times a day (BID) | ORAL | 3 refills | Status: DC

## 2023-02-02 MED ORDER — ATORVASTATIN CALCIUM 20 MG PO TABS: ORAL_TABLET | ORAL | 3 refills | Status: DC

## 2023-02-02 NOTE — Patient Instructions (Addendum)
[facility] at [phone].  Per the referral coordinators at each office they should be calling you in the next few days but they are aware of the referral.

## 2023-02-02 NOTE — Progress Notes (Signed)
Established Patient Office Visit   Subjective  Patient ID: Andrew Gaines, male    DOB: 1965-12-31  Age: 57 y.o. MRN: [id number]  Chief Complaint  Patient presents with   Annual Exam    Hernia has been tender later     Patient is a 57 year old male seen for CPE and follow-up on ongoing conditions/medication management.  Patient states he has been doing well overall.  Sleep is good.  Appetite is good.  Working with [employer], has lost 20 pounds.  Hoping to lose 40 more lbs.  Has not heard anything about referral to [facility] for 2nd opinion for umbilical hernia.  Patient states hernia is now becoming sore more often.  Patient followed by urology for history of prostate cancer, ~9 yrs s/p resection.  Recent PSA was negative.  Stable on Lexapro.  Patient has not been checking blood sugar at home.  Taking metformin.  Last hemoglobin A1c 7.1% on 09/05/2021    Past Medical History:  Diagnosis Date   Cancer ([facility]) 06/2014   prostate cancer    Complication of anesthesia    n&v   Diabetes mellitus without complication (HCC)    DVT (deep venous thrombosis) (HCC)    and PE after knee surgery "years ago"   GERD (gastroesophageal reflux disease)    Hiatal hernia    PONV (postoperative nausea and vomiting)    Sleep apnea    scheduled to get CPAP    Unspecified essential hypertension    Unspecified sinusitis (chronic)    Past Surgical History:  Procedure Laterality Date   JOINT REPLACEMENT     bilateral knee    KNEE ARTHROSCOPY     bilateral    LYMPHADENECTOMY Bilateral 07/20/2014   Procedure: LYMPHADENECTOMY;  Surgeon: Heloise Purpura, MD;  Location: [facility];  Service: Urology;  Laterality: Bilateral;   ROBOT ASSISTED LAPAROSCOPIC RADICAL PROSTATECTOMY N/A 07/20/2014   Procedure: ROBOTIC ASSISTED LAPAROSCOPIC RADICAL PROSTATECTOMY LEVEL 2;  Surgeon: Heloise Purpura, MD;  Location: [facility];  Service: Urology;  Laterality: N/A;   SHOULDER ARTHROSCOPY     bilateral    Social  History   Tobacco Use   Smoking status: Never   Smokeless tobacco: Never  Vaping Use   Vaping Use: Never used  Substance Use Topics   Alcohol use: Yes    Comment: 2-3 times per month   Drug use: No   Family History  Problem Relation Age of Onset   Breast cancer Maternal Grandmother    Colon cancer Paternal Grandmother 16   Esophageal cancer Neg Hx    Rectal cancer Neg Hx    Stomach cancer Neg Hx    No Active Allergies    ROS Negative unless stated above    Objective:     BP 122/82 (BP Location: Left Arm, Patient Position: Sitting, Cuff Size: Large)   Pulse 70   Temp 98.9 F (37.2 C) (Oral)   Ht 5\' 11"  (1.803 m)   Wt 243 lb 6.4 oz (110.4 kg)   SpO2 95%   BMI 33.95 kg/m  BP Readings from Last 3 Encounters:  02/02/23 122/82  08/08/22 130/74  06/05/22 126/86   Wt Readings from Last 3 Encounters:  02/02/23 243 lb 6.4 oz (110.4 kg)  08/08/22 263 lb (119.3 kg)  06/05/22 264 lb 6.4 oz (119.9 kg)      Physical Exam Constitutional:      Appearance: Normal appearance.  HENT:     Head: Normocephalic and atraumatic.  Right Ear: Tympanic membrane, ear canal and external ear normal.     Left Ear: Tympanic membrane, ear canal and external ear normal.     Nose: Nose normal.     Mouth/Throat:     Mouth: Mucous membranes are moist.     Pharynx: No oropharyngeal exudate or posterior oropharyngeal erythema.  Eyes:     General: No scleral icterus.    Extraocular Movements: Extraocular movements intact.     Conjunctiva/sclera: Conjunctivae normal.     Pupils: Pupils are equal, round, and reactive to light.  Neck:     Thyroid: No thyromegaly.  Cardiovascular:     Rate and Rhythm: Normal rate and regular rhythm.     Pulses: Normal pulses.     Heart sounds: Normal heart sounds. No murmur heard.    No friction rub.  Pulmonary:     Effort: Pulmonary effort is normal.     Breath sounds: Normal breath sounds. No wheezing, rhonchi or rales.  Abdominal:     General:  Bowel sounds are normal.     Palpations: Abdomen is soft.     Tenderness: There is no abdominal tenderness.     Hernia: A hernia is present.  Musculoskeletal:        General: No deformity. Normal range of motion.  Lymphadenopathy:     Cervical: No cervical adenopathy.  Skin:    General: Skin is warm and dry.     Findings: No lesion.  Neurological:     General: No focal deficit present.     Mental Status: He is alert and oriented to person, place, and time.  Psychiatric:        Mood and Affect: Mood normal.        Thought Content: Thought content normal.       02/02/2023   10:58 AM 06/05/2022   10:42 AM 03/20/2022    3:31 PM  Depression screen PHQ 2/9  Decreased Interest 0 0 0  Down, Depressed, Hopeless 0 0 0  PHQ - 2 Score 0 0 0  Altered sleeping 0 0 0  Tired, decreased energy 1 0 1  Change in appetite 0 0 0  Feeling bad or failure about yourself  0 0 0  Trouble concentrating 0 0 0  Moving slowly or fidgety/restless 0 0 0  Suicidal thoughts 0 0 0  PHQ-9 Score 1 0 1  Difficult doing work/chores Not difficult at all  Not difficult at all      02/02/2023   10:59 AM  GAD 7 : Generalized Anxiety Score  Nervous, Anxious, on Edge 0  Control/stop worrying 0  Worry too much - different things 0  Trouble relaxing 0  Restless 0  Easily annoyed or irritable 0  Afraid - awful might happen 0  Total GAD 7 Score 0      No results found for any visits on 02/02/23.    Assessment & Plan:  Well adult exam -Age-appropriate health screenings discussed -Obtain labs -Colonoscopy up-to-date done 06/29/2016 -Immunizations reviewed -Next CPE in 1 year -     CBC with Differential/Platelet  Depression, recurrent (HCC) -PHQ-9 score 1 -GAD-7 score 0 -Stable on Lexapro 20 mg daily -     Escitalopram Oxalate; Take 1 tablet (20 mg total) by mouth daily.  Dispense: 90 tablet; Refill: 1  Essential hypertension -Well-controlled -Continue current medication losartan 100 mg  daily -Continue lifestyle modifications. -     Comprehensive metabolic panel -     CBC with Differential/Platelet -  TSH -     T4, free -     Losartan Potassium; Take 1 tablet (100 mg total) by mouth daily.  Dispense: 90 tablet; Refill: 3  Mixed hyperlipidemia -Controlled -Continue atorvastatin 20 mg daily and lifestyle modifications -     Comprehensive metabolic panel -     Lipid panel -     Atorvastatin Calcium; TAKE 1 TABLET BY MOUTH DAILY  Dispense: 90 tablet; Refill: 3  Type 2 diabetes mellitus without complication, without long-term current use of insulin (HCC) -Hemoglobin A1c 7.1% on 09/05/2021 -Continue metformin 500 mg twice daily -Foot exam done this visit -Eye exam done 12/27/2022 -Continue statin and ARB -     Hemoglobin A1c -     metFORMIN HCl; Take 1 tablet (500 mg total) by mouth 2 (two) times daily with a meal.  Dispense: 180 tablet; Refill: 3 -     Microalbumin / creatinine urine ratio  History of prostate cancer -Stable status post prostatectomy -Continue follow-up with urology  Umbilical hernia without obstruction and without gangrene -Referral for second opinion placed August 2023 however patient has yet to hear a response.  Referral coordinator contacted regarding this.  States [facility] now aware a referral. -Patient given information to contact [facility] to set up appointment -Given strict precautions for worsening symptoms   Return in about 6 months (around 08/04/2023).   Deeann Saint, MD

## 2023-02-20 DIAGNOSIS — H16202 Unspecified keratoconjunctivitis, left eye: Secondary | ICD-10-CM | POA: Diagnosis not present

## 2023-03-13 DIAGNOSIS — K432 Incisional hernia without obstruction or gangrene: Secondary | ICD-10-CM | POA: Diagnosis not present

## 2023-03-13 DIAGNOSIS — M6208 Separation of muscle (nontraumatic), other site: Secondary | ICD-10-CM | POA: Diagnosis not present

## 2023-03-13 DIAGNOSIS — K429 Umbilical hernia without obstruction or gangrene: Secondary | ICD-10-CM | POA: Diagnosis not present

## 2023-03-26 DIAGNOSIS — M1712 Unilateral primary osteoarthritis, left knee: Secondary | ICD-10-CM | POA: Diagnosis not present

## 2023-04-13 DIAGNOSIS — M1712 Unilateral primary osteoarthritis, left knee: Secondary | ICD-10-CM | POA: Diagnosis not present

## 2023-05-16 DIAGNOSIS — N5201 Erectile dysfunction due to arterial insufficiency: Secondary | ICD-10-CM | POA: Diagnosis not present

## 2023-05-16 DIAGNOSIS — C61 Malignant neoplasm of prostate: Secondary | ICD-10-CM | POA: Diagnosis not present

## 2023-05-24 ENCOUNTER — Encounter: Payer: Self-pay | Admitting: Family Medicine

## 2023-05-24 ENCOUNTER — Ambulatory Visit: Payer: 59 | Admitting: Family Medicine

## 2023-05-24 VITALS — BP 138/82 | HR 76 | Temp 98.6°F | Ht 71.0 in | Wt 253.0 lb

## 2023-05-24 DIAGNOSIS — E119 Type 2 diabetes mellitus without complications: Secondary | ICD-10-CM | POA: Diagnosis not present

## 2023-05-24 DIAGNOSIS — I10 Essential (primary) hypertension: Secondary | ICD-10-CM | POA: Diagnosis not present

## 2023-05-24 DIAGNOSIS — Z01818 Encounter for other preprocedural examination: Secondary | ICD-10-CM | POA: Diagnosis not present

## 2023-05-24 NOTE — Progress Notes (Signed)
Chief Complaint  Patient presents with   Pre-op Exam    Patient is having a left total knee arthroplasty- with [facility]     HPI:  Patient is seen for optimization of general medical care prior to surgery.  Will have hernia repair surgery then knee surgery.  Had cortisone injection without improvement in symptoms.  Surgery type: Left TKR Date of surgery: TBD  Kidney disease? no Prior surgeries/Issues following anesthesia? Post-op nausea  Hx MI, heart arrythmia, CHF, angina or stroke? H/o PE/DVT Epilepsy or Seizures? None  Arthritis or problems with neck or jaw? None   Thyroid disease? None  Liver disease? None  Asthma, COPD or chronic lung disease? OSA on CPAP Diabetes?  DM2 controlled on metformin 500 mg twice daily  Other: Poor nutrition, Frail or other: no  METS:  ?Can take care of self, such as eat, dress, or use the toilet (1 MET). yes ?Can walk up a flight of steps or a hill (4 METs).yes ?Can do heavy work around the house such as scrubbing floors or lifting or moving heavy furniture (between 4 and 10 METs). yes ?Can participate in strenuous sports such as swimming, singles tennis, football, basketball, and skiing (>10 METs) . AHA Risks: Major predictors that require intensive management and may lead to delay in or cancellation of the operative procedure unless emergent: NONE   Unstable coronary syndromes including unstable or severe angina or recent MI   Decompensated heart failure including NYHA functional class IV or worsening or new-onset HF   Significant arrhythmias including high grade AV block, symptomatic ventricular arrhythmias, supraventricular arrhythmias with ventricular rate >100 bpm at rest, symptomatic bradycardia, and newly recognized ventricular tachycardia   Severe heart valve disease including severe aortic stenosis or symptomatic mitral stenosis   Other clinical predictors that warrant careful assessment of current status: NONE   History  of ischemic heart disease  History of cerebrovascular disease   History of compensated heart failure or prior heart failure   Diabetes mellitus   Renal insufficiency  Type of surgery and Risk: 1) High risk (reported risk of cardiac death or nonfatal myocardial infarction [MI] often greater than 5 percent):   Aortic and other major vascular surgery   Peripheral artery surgery   2)Intermediate risk (reported risk of cardiac death or nonfatal MI generally 1 to 5 percent):   Carotid endarterectomy   Head and neck surgery   Intraperitoneal and intrathoracic surgery   Orthopedic surgery   Prostate surgery   3)Low risk (reported risk of cardiac death or nonfatal MI generally less than 1 percent):   Ambulatory surgery   Endoscopic procedures   Superficial procedure   Cataract surgery   Breast surgery  Medications that need to be addressed prior to surgery: None Discontinue acei/arbs/non-statin lipid lowering drugs day of surgery ASA stop 7 days before or discuss with cardiology if CV risks, other anticoagulants discuss with cardiology.  ROS: See pertinent positives and negatives per HPI. 11 point ROS negative except where noted.  Past Medical History:  Diagnosis Date   Cancer (HCC) 06/2014   prostate cancer    Complication of anesthesia    n&v   Diabetes mellitus without complication (HCC)    DVT (deep venous thrombosis) (HCC)    and PE after knee surgery "years ago"   GERD (gastroesophageal reflux disease)    Hiatal hernia    PONV (postoperative nausea and vomiting)    Sleep apnea    scheduled to get CPAP  Unspecified essential hypertension    Unspecified sinusitis (chronic)     Past Surgical History:  Procedure Laterality Date   JOINT REPLACEMENT     bilateral knee    KNEE ARTHROSCOPY     bilateral    LYMPHADENECTOMY Bilateral 07/20/2014   Procedure: LYMPHADENECTOMY;  Surgeon: Heloise Purpura, MD;  Location: [facility];  Service: Urology;  Laterality: Bilateral;   ROBOT  ASSISTED LAPAROSCOPIC RADICAL PROSTATECTOMY N/A 07/20/2014   Procedure: ROBOTIC ASSISTED LAPAROSCOPIC RADICAL PROSTATECTOMY LEVEL 2;  Surgeon: Heloise Purpura, MD;  Location: [facility];  Service: Urology;  Laterality: N/A;   SHOULDER ARTHROSCOPY     bilateral     Family History  Problem Relation Age of Onset   Breast cancer Maternal Grandmother    Colon cancer Paternal Grandmother 67   Esophageal cancer Neg Hx    Rectal cancer Neg Hx    Stomach cancer Neg Hx     Social History   Socioeconomic History   Marital status: Married    Spouse name: Debbie   Number of children: 1   Years of education: Bachelor   Highest education level: Not on file  Occupational History   Occupation: Airline pilot  Tobacco Use   Smoking status: Never   Smokeless tobacco: Never  Vaping Use   Vaping status: Never Used  Substance and Sexual Activity   Alcohol use: Yes    Comment: 2-3 times per month   Drug use: No   Sexual activity: Not on file  Other Topics Concern   Not on file  Social History Narrative   Patient is married to [place], has 1 child    Patient is right handed   Education level is Energy manager degree   Caffeine consumption is 2-3 cups daily   Daughter is Holiday representative in [employer]       Social Determinants of Health   Financial Resource Strain: Not on file  Food Insecurity: Not on file  Transportation Needs: Not on file  Physical Activity: Not on file  Stress: Not on file  Social Connections: Unknown (12/31/2021)   Received from [facility]   Social Network    Social Network: Not on file     Current Outpatient Medications:    amoxicillin (AMOXIL) 500 MG capsule, Take 500 mg by mouth 3 (three) times daily., Disp: , Rfl:    aspirin EC 81 MG tablet, Take 81 mg by mouth daily., Disp: , Rfl:    atorvastatin (LIPITOR) 20 MG tablet, TAKE 1 TABLET BY MOUTH DAILY, Disp: 90 tablet, Rfl: 3   escitalopram (LEXAPRO) 20 MG tablet, Take 1 tablet (20 mg total) by mouth daily., Disp:  90 tablet, Rfl: 1   losartan (COZAAR) 100 MG tablet, Take 1 tablet (100 mg total) by mouth daily., Disp: 90 tablet, Rfl: 3   losartan (COZAAR) 50 MG tablet, Take 1 tablet by mouth every morning., Disp: , Rfl:    meloxicam (MOBIC) 15 MG tablet, Take 15 mg by mouth as needed., Disp: , Rfl:    metFORMIN (GLUCOPHAGE) 500 MG tablet, Take 1 tablet (500 mg total) by mouth 2 (two) times daily with a meal., Disp: 180 tablet, Rfl: 3   naltrexone (DEPADE) 50 MG tablet, TAKE 1 TABLET BY MOUTH DAILY, Disp: 30 tablet, Rfl: 1   neomycin-polymyxin b-dexamethasone (MAXITROL) 3.5-10000-0.1 SUSP, Place into the right eye., Disp: , Rfl:    sildenafil (REVATIO) 20 MG tablet, Take 20 mg by mouth as needed., Disp: , Rfl:   Current Facility-Administered Medications:  0.9 %  sodium chloride infusion, 500 mL, Intravenous, Continuous, Armbruster, Willaim Rayas, MD  EXAM:  Vitals:   05/24/23 1024 05/24/23 1100  BP: (!) 140/90 138/82  Pulse: 76   Temp: 98.6 F (37 C)   SpO2: 95%     Body mass index is 35.29 kg/m.  GENERAL: vitals reviewed and listed above, alert, oriented, appears well hydrated and in no acute distress  HEENT: atraumatic, conjunttiva clear, no obvious abnormalities on inspection of external nose and ears  NECK: no obvious masses on inspection, no carotid bruits  LUNGS: clear to auscultation bilaterally, no wheezes, rales or rhonchi, good air movement  CV: HRRR, no peripheral edema, no JVD, BP normal range, normal radial pulses  MS: moves all extremities without noticeable abnormality.  No TTP of left knee at joint line.  PSYCH: pleasant and cooperative, no obvious depression or anxiety  ASSESSMENT AND PLAN:  Discussed the following assessment and plan:  Preop examination -Left TKR planned with Ortho -Will need to stop aspirin 5 d prior to procedure -preop form completed. -labs to be done by Ortho -Discussed with anesthesiology history of OSA and postop nausea  Essential  hypertension -Initially elevated -Normal on repeat -Continue losartan 100 mg daily -Discussed importance of lifestyle modifications -Patient to monitor BP at home for elevations consistently greater than 140/90 notify clinic  Type 2 diabetes mellitus without complication, without long-term current use of insulin (HCC) -Hemoglobin A1c 6.4% on 02/02/2023 -Lifestyle modifications -Continue metformin 500 mg twice daily  Assessment: -Risk factors: OSA, DM 2, HTN -Surgery Risks:intermediate -age, nutritional status, fraility: good nutritional status, age >101, no fraility -functional capacity: > 4 METs wethout symptoms -comorbidities: none Patient Specific Risks: patient is low risk for intermediate risks surgery   Recommendations for optimizing general medical care prior to surgery: -advised patient to discuss specific risks morbidity and mortality of surgery with surgeon, CV risks discussed with patient -advised patient will defer to surgeon for post-op DVT prophylaxis and post op care -no specific medical recommendations for this patient at this time and no recommendations to defer surgery or for further CV testing prior to surgery -form for pre-op optimization of general medical care prior to surgery faxed to surgeon office  -Patient advised to return or notify a doctor immediately if symptoms worsen or persist or new concerns arise.   Andrew Gaines

## 2023-07-02 ENCOUNTER — Ambulatory Visit: Payer: Self-pay | Admitting: Surgery

## 2023-07-02 NOTE — H&P (View-Only) (Signed)
 REFERRING PHYSICIAN: Deeann Saint, MD  PROVIDER: Ignacia Gentzler Myra Rude, MD   Chief Complaint: New Consultation (Umbilical hernia, incisional hernia)  History of Present Illness:  Patient is referred by Dr. Abbe Amsterdam for surgical evaluation and management of a known umbilical hernia and probable incisional hernia. Patient developed a hernia following prostatectomy 9 years ago. Patient first noted a bulge at the umbilicus shortly after his prostate procedure. Over the years this is gradually increased in size. Patient has developed a second bulge just slightly cephalad and to the left of the umbilicus. This causes intermittent mild discomfort with physical activity. Patient presents today to discuss surgical repair. Patient denies any signs or symptoms of obstruction. Patient has not had any other abdominal surgery.  Review of Systems: A complete review of systems was obtained from the patient. I have reviewed this information and discussed as appropriate with the patient. See HPI as well for other ROS.  Review of Systems  Constitutional: Negative.  HENT: Negative.  Eyes: Negative.  Respiratory: Negative.  Cardiovascular: Negative.  Gastrointestinal: Positive for abdominal pain.  Genitourinary: Negative.  Musculoskeletal: Negative.  Skin: Negative.  Neurological: Negative.  Endo/Heme/Allergies: Negative.  Psychiatric/Behavioral: Negative.    Medical History: Past Medical History:  Diagnosis Date  DVT (deep venous thrombosis) (CMS/HHS-HCC)  History of cancer  Pulmonary embolism (CMS/HHS-HCC)  Sleep apnea   Patient Active Problem List  Diagnosis  Umbilical hernia without obstruction and without gangrene  Incisional hernia, without obstruction or gangrene  Diastasis recti   Past Surgical History:  Procedure Laterality Date  ARTHROSCOPY SHOULDER  INGUINOFEMORAL LYMPHADENECTOMY Bilateral  REPLACEMENT UNICONDYLAR JOINT KNEE  ROBOT ASSISTED LAPAROSCOPIC  PROSTATECTOMY, RETROPUBIC RADICAL, INCLUDING NERVE SPARING    No Known Allergies  Current Outpatient Medications on File Prior to Visit  Medication Sig Dispense Refill  atorvastatin (LIPITOR) 20 MG tablet Take 20 mg by mouth once daily  escitalopram oxalate (LEXAPRO) 20 MG tablet Take 20 mg by mouth once daily  losartan (COZAAR) 100 MG tablet Take 100 mg by mouth once daily  metFORMIN (GLUCOPHAGE) 500 MG tablet Take 500 mg by mouth 2 (two) times daily with meals  omeprazole (PRILOSEC) 20 MG DR capsule  pregabalin (LYRICA) 100 MG capsule  warfarin (COUMADIN) 10 MG tablet  zolpidem (AMBIEN) 10 mg tablet   No current facility-administered medications on file prior to visit.   Family History  Problem Relation Age of Onset  Breast cancer Maternal Grandmother  Colon cancer Paternal Grandmother    Social History   Tobacco Use  Smoking Status Not on file  Smokeless Tobacco Not on file    Social History   Socioeconomic History  Marital status: Divorced   Social Determinants of Health   Received from [facility]  Social Network   Objective:   Vitals:  BP: 138/78  Pulse: 93  Temp: 36.8 C (98.3 F)  SpO2: 93%  Weight: (!) 113.3 kg (249 lb 12.8 oz)  Height: 182.9 cm (6')  PainSc: 0-No pain   Body mass index is 33.88 kg/m.  Physical Exam   GENERAL APPEARANCE Comfortable, no acute issues Development: normal Gross deformities: none  SKIN Rash, lesions, ulcers: none Induration, erythema: none Nodules: none palpable  EYES Conjunctiva and lids: normal Pupils: equal and reactive  EARS, NOSE, MOUTH, THROAT External ears: no lesion or deformity External nose: no lesion or deformity Hearing: grossly normal  NECK Symmetric: yes Trachea: midline  ABDOMEN Soft without distention. Well-healed surgical wound in the midline just above the umbilicus.  Umbilicus shows an obvious hernia containing adipose tissue which is not reducible. With sit up maneuver and  Valsalva there is a bulge just superior and to the left of the umbilicus. This is mildly tender to palpation. This may represent a second hernia defect. With sit up maneuver there is an obvious rectus diastasis.  GENITOURINARY/RECTAL Not assessed  MUSCULOSKELETAL Station and gait: normal Digits and nails: no clubbing or cyanosis Muscle strength: grossly normal all extremities Range of motion: grossly normal all extremities Deformity: none  LYMPHATIC Cervical: none palpable Supraclavicular: none palpable  PSYCHIATRIC Oriented to person, place, and time: yes Mood and affect: normal for situation Judgment and insight: appropriate for situation   Assessment and Plan:   Umbilical hernia without obstruction and without gangrene Incisional hernia, without obstruction or gangrene Diastasis recti  Patient presents on referral from his primary care physician for surgical evaluation and management of an umbilical hernia and likely incisional hernia which had become mildly symptomatic. On examination there is clearly a hernia at the level of the umbilicus containing incarcerated adipose tissue. There is also likely a second bulge just cephalad and to the left of the umbilicus which may represent an incisional hernia. With sit up maneuver there is clearly a large rectus diastasis.  Today we discussed operative repair. I have recommended making an incision in the midline just above the umbilicus in order to evaluate the umbilical hernia and the probable incisional hernia which is slightly more cephalad. We also discussed the rectus diastasis. We discussed how those would require repair by plastic surgery. We would not plan to repair the rectus diastasis during this procedure.  I suspect there are 2 defects present at the umbilicus and slightly above the umbilicus which will require repair with mesh. We discussed the use of mesh patch. We discussed the restrictions on his activity following surgery.  We will plan to do this as an outpatient surgical procedure. The patient understands and would like to schedule surgery in November 2024.   Darnell Level, MD [facility] practice Office: [phone]

## 2023-07-02 NOTE — H&P (Signed)
REFERRING PHYSICIAN: Deeann Saint, MD  PROVIDER: Ignacia Gentzler Myra Rude, MD   Chief Complaint: New Consultation (Umbilical hernia, incisional hernia)  History of Present Illness:  Patient is referred by Dr. Abbe Amsterdam for surgical evaluation and management of a known umbilical hernia and probable incisional hernia. Patient developed a hernia following prostatectomy 9 years ago. Patient first noted a bulge at the umbilicus shortly after his prostate procedure. Over the years this is gradually increased in size. Patient has developed a second bulge just slightly cephalad and to the left of the umbilicus. This causes intermittent mild discomfort with physical activity. Patient presents today to discuss surgical repair. Patient denies any signs or symptoms of obstruction. Patient has not had any other abdominal surgery.  Review of Systems: A complete review of systems was obtained from the patient. I have reviewed this information and discussed as appropriate with the patient. See HPI as well for other ROS.  Review of Systems  Constitutional: Negative.  HENT: Negative.  Eyes: Negative.  Respiratory: Negative.  Cardiovascular: Negative.  Gastrointestinal: Positive for abdominal pain.  Genitourinary: Negative.  Musculoskeletal: Negative.  Skin: Negative.  Neurological: Negative.  Endo/Heme/Allergies: Negative.  Psychiatric/Behavioral: Negative.    Medical History: Past Medical History:  Diagnosis Date  DVT (deep venous thrombosis) (CMS/HHS-HCC)  History of cancer  Pulmonary embolism (CMS/HHS-HCC)  Sleep apnea   Patient Active Problem List  Diagnosis  Umbilical hernia without obstruction and without gangrene  Incisional hernia, without obstruction or gangrene  Diastasis recti   Past Surgical History:  Procedure Laterality Date  ARTHROSCOPY SHOULDER  INGUINOFEMORAL LYMPHADENECTOMY Bilateral  REPLACEMENT UNICONDYLAR JOINT KNEE  ROBOT ASSISTED LAPAROSCOPIC  PROSTATECTOMY, RETROPUBIC RADICAL, INCLUDING NERVE SPARING    No Known Allergies  Current Outpatient Medications on File Prior to Visit  Medication Sig Dispense Refill  atorvastatin (LIPITOR) 20 MG tablet Take 20 mg by mouth once daily  escitalopram oxalate (LEXAPRO) 20 MG tablet Take 20 mg by mouth once daily  losartan (COZAAR) 100 MG tablet Take 100 mg by mouth once daily  metFORMIN (GLUCOPHAGE) 500 MG tablet Take 500 mg by mouth 2 (two) times daily with meals  omeprazole (PRILOSEC) 20 MG DR capsule  pregabalin (LYRICA) 100 MG capsule  warfarin (COUMADIN) 10 MG tablet  zolpidem (AMBIEN) 10 mg tablet   No current facility-administered medications on file prior to visit.   Family History  Problem Relation Age of Onset  Breast cancer Maternal Grandmother  Colon cancer Paternal Grandmother    Social History   Tobacco Use  Smoking Status Not on file  Smokeless Tobacco Not on file    Social History   Socioeconomic History  Marital status: Divorced   Social Determinants of Health   Received from [employer]  Social Network   Objective:   Vitals:  BP: 138/78  Pulse: 93  Temp: 36.8 C (98.3 F)  SpO2: 93%  Weight: (!) 113.3 kg (249 lb 12.8 oz)  Height: 182.9 cm (6')  PainSc: 0-No pain   Body mass index is 33.88 kg/m.  Physical Exam   GENERAL APPEARANCE Comfortable, no acute issues Development: normal Gross deformities: none  SKIN Rash, lesions, ulcers: none Induration, erythema: none Nodules: none palpable  EYES Conjunctiva and lids: normal Pupils: equal and reactive  EARS, NOSE, MOUTH, THROAT External ears: no lesion or deformity External nose: no lesion or deformity Hearing: grossly normal  NECK Symmetric: yes Trachea: midline  ABDOMEN Soft without distention. Well-healed surgical wound in the midline just above the umbilicus.  Umbilicus shows an obvious hernia containing adipose tissue which is not reducible. With sit up maneuver and  Valsalva there is a bulge just superior and to the left of the umbilicus. This is mildly tender to palpation. This may represent a second hernia defect. With sit up maneuver there is an obvious rectus diastasis.  GENITOURINARY/RECTAL Not assessed  MUSCULOSKELETAL Station and gait: normal Digits and nails: no clubbing or cyanosis Muscle strength: grossly normal all extremities Range of motion: grossly normal all extremities Deformity: none  LYMPHATIC Cervical: none palpable Supraclavicular: none palpable  PSYCHIATRIC Oriented to person, place, and time: yes Mood and affect: normal for situation Judgment and insight: appropriate for situation   Assessment and Plan:   Umbilical hernia without obstruction and without gangrene Incisional hernia, without obstruction or gangrene Diastasis recti  Patient presents on referral from his primary care physician for surgical evaluation and management of an umbilical hernia and likely incisional hernia which had become mildly symptomatic. On examination there is clearly a hernia at the level of the umbilicus containing incarcerated adipose tissue. There is also likely a second bulge just cephalad and to the left of the umbilicus which may represent an incisional hernia. With sit up maneuver there is clearly a large rectus diastasis.  Today we discussed operative repair. I have recommended making an incision in the midline just above the umbilicus in order to evaluate the umbilical hernia and the probable incisional hernia which is slightly more cephalad. We also discussed the rectus diastasis. We discussed how those would require repair by plastic surgery. We would not plan to repair the rectus diastasis during this procedure.  I suspect there are 2 defects present at the umbilicus and slightly above the umbilicus which will require repair with mesh. We discussed the use of mesh patch. We discussed the restrictions on his activity following surgery.  We will plan to do this as an outpatient surgical procedure. The patient understands and would like to schedule surgery in November 2024.   Darnell Level, MD [facility] practice Office: [phone]

## 2023-07-02 NOTE — Progress Notes (Signed)
Sent message, via epic in basket, requesting orders in epic from surgeon.  

## 2023-07-04 NOTE — Patient Instructions (Signed)
SURGICAL WAITING ROOM VISITATION  Patients having surgery or a procedure may have no more than 2 support people in the waiting area - these visitors may rotate.    Children under the age of 3 must have an adult with them who is not the patient.  Due to an increase in RSV and influenza rates and associated hospitalizations, children ages 69 and under may not visit patients in [facility] hospitals.  If the patient needs to stay at the hospital during part of their recovery, the visitor guidelines for inpatient rooms apply. Pre-op nurse will coordinate an appropriate time for 1 support person to accompany patient in pre-op.  This support person may not rotate.    Please refer to the [facility] website for the visitor guidelines for Inpatients (after your surgery is over and you are in a regular room).       Your procedure is scheduled on:  07/10/2023    Report to [facility] Main Entrance    Report to admitting at  0730 AM   Call this number if you have problems the morning of surgery [phone]   Do not eat food :After Midnight.   After Midnight you may have the following liquids until __ 0630____ AM  DAY OF SURGERY  Water Non-Citrus Juices (without pulp, NO RED-Apple, White grape, White cranberry) Black Coffee (NO MILK/CREAM OR CREAMERS, sugar ok)  Clear Tea (NO MILK/CREAM OR CREAMERS, sugar ok) regular and decaf                             Plain Jell-O (NO RED)                                           Fruit ices (not with fruit pulp, NO RED)                                     Popsicles (NO RED)                                                               Sports drinks like Gatorade (NO RED)                    The day of surgery:  Drink ONE (1) Pre-Surgery Clear Ensure or G2 at  0630AM ( have completed by )  the morning of surgery. Drink in one sitting. Do not sip.  This drink was given to you during your hospital  pre-op appointment visit. Nothing else to  drink after completing the  Pre-Surgery Clear Ensure or G2.          If you have questions, please contact your surgeon's office.       Oral Hygiene is also important to reduce your risk of infection.                                    Remember - BRUSH YOUR TEETH THE MORNING OF SURGERY WITH  YOUR REGULAR TOOTHPASTE  DENTURES WILL BE REMOVED PRIOR TO SURGERY PLEASE DO NOT APPLY "Poly grip" OR ADHESIVES!!!   Do NOT smoke after Midnight   Stop all vitamins and herbal supplements 7 days before surgery.                 Take these medicines the morning of surgery with A SIP OF WATER: lexapro    Metformin- none day of surgery   DO NOT TAKE ANY ORAL DIABETIC MEDICATIONS DAY OF YOUR SURGERY  Bring CPAP mask and tubing day of surgery.                              You may not have any metal on your body including hair pins, jewelry, and body piercing             Do not wear make-up, lotions, powders, perfumes/cologne, or deodorant  Do not wear nail polish including gel and S&S, artificial/acrylic nails, or any other type of covering on natural nails including finger and toenails. If you have artificial nails, gel coating, etc. that needs to be removed by a nail salon please have this removed prior to surgery or surgery may need to be canceled/ delayed if the surgeon/ anesthesia feels like they are unable to be safely monitored.   Do not shave  48 hours prior to surgery.               Men may shave face and neck.   Do not bring valuables to the hospital. [facility] IS NOT             RESPONSIBLE   FOR VALUABLES.   Contacts, glasses, dentures or bridgework may not be worn into surgery.   Bring small overnight bag day of surgery.   DO NOT BRING YOUR HOME MEDICATIONS TO THE HOSPITAL. PHARMACY WILL DISPENSE MEDICATIONS LISTED ON YOUR MEDICATION LIST TO YOU DURING YOUR ADMISSION IN THE HOSPITAL!    Patients discharged on the day of surgery will not be allowed to drive home.  Someone  NEEDS to stay with you for the first 24 hours after anesthesia.   Special Instructions: Bring a copy of your healthcare power of attorney and living will documents the day of surgery if you haven't scanned them before.              Please read over the following fact sheets you were given: IF YOU HAVE QUESTIONS ABOUT YOUR PRE-OP INSTRUCTIONS PLEASE CALL [phone]   If you received a COVID test during your pre-op visit  it is requested that you wear a mask when out in public, stay away from anyone that may not be feeling well and notify your surgeon if you develop symptoms. If you test positive for Covid or have been in contact with anyone that has tested positive in the last 10 days please notify you surgeon.    [facility] - Preparing for Surgery Before surgery, you can play an important role.  Because skin is not sterile, your skin needs to be as free of germs as possible.  You can reduce the number of germs on your skin by washing with CHG (chlorahexidine gluconate) soap before surgery.  CHG is an antiseptic cleaner which kills germs and bonds with the skin to continue killing germs even after washing. Please DO NOT use if you have an allergy to CHG or antibacterial soaps.  If your skin becomes  reddened/irritated stop using the CHG and inform your nurse when you arrive at Short Stay. Do not shave (including legs and underarms) for at least 48 hours prior to the first CHG shower.  You may shave your face/neck. Please follow these instructions carefully:  1.  Shower with CHG Soap the night before surgery and the  morning of Surgery.  2.  If you choose to wash your hair, wash your hair first as usual with your  normal  shampoo.  3.  After you shampoo, rinse your hair and body thoroughly to remove the  shampoo.                           4.  Use CHG as you would any other liquid soap.  You can apply chg directly  to the skin and wash                       Gently with a scrungie or clean  washcloth.  5.  Apply the CHG Soap to your body ONLY FROM THE NECK DOWN.   Do not use on face/ open                           Wound or open sores. Avoid contact with eyes, ears mouth and genitals (private parts).                       Wash face,  Genitals (private parts) with your normal soap.             6.  Wash thoroughly, paying special attention to the area where your surgery  will be performed.  7.  Thoroughly rinse your body with warm water from the neck down.  8.  DO NOT shower/wash with your normal soap after using and rinsing off  the CHG Soap.                9.  Pat yourself dry with a clean towel.            10.  Wear clean pajamas.            11.  Place clean sheets on your bed the night of your first shower and do not  sleep with pets. Day of Surgery : Do not apply any lotions/deodorants the morning of surgery.  Please wear clean clothes to the hospital/surgery center.  FAILURE TO FOLLOW THESE INSTRUCTIONS MAY RESULT IN THE CANCELLATION OF YOUR SURGERY PATIENT SIGNATURE_________________________________  NURSE SIGNATURE__________________________________  ________________________________________________________________________

## 2023-07-04 NOTE — Progress Notes (Addendum)
Anesthesia Review:  PCP: Andrew Gaines LOV 05/24/23- preop exam  Cardiologist : none  Chest x-ray : EKG : 07/09/23  Echo : Stress test: Cardiac Cath :  Activity level: can do a flight of stairs without difficulty  Sleep Study/ CPAP : has cpap  Fasting Blood Sugar :      / Checks Blood Sugar -- times a day:   Blood Thinner/ Instructions /Last Dose: ASA / Instructions/ Last Dose :    Prediabetes- does not check glucose at home   Metformin- none am of surgery    Blood pressure was 168/102 in right arm and in left arm was 145/103.  PT denies any chest pain, shortness of breath, dizziness, headache or blurred vision.  PT states he periodically checks blood pressure at home.  PT given copy of blood pressure readings from preop and instructed to monitor blood pressure at home and to be in touch with PCP.  PT did take am blood pressure med this am.

## 2023-07-07 ENCOUNTER — Other Ambulatory Visit: Payer: Self-pay | Admitting: Family Medicine

## 2023-07-07 DIAGNOSIS — F339 Major depressive disorder, recurrent, unspecified: Secondary | ICD-10-CM

## 2023-07-09 ENCOUNTER — Encounter (HOSPITAL_COMMUNITY)
Admission: RE | Admit: 2023-07-09 | Discharge: 2023-07-09 | Disposition: A | Discharge status: Home / Self Care | Payer: 59 | Source: Ambulatory Visit | Attending: Surgery | Admitting: Surgery

## 2023-07-09 ENCOUNTER — Other Ambulatory Visit: Payer: Self-pay

## 2023-07-09 ENCOUNTER — Encounter (HOSPITAL_COMMUNITY): Payer: Self-pay | Admitting: Surgery

## 2023-07-09 ENCOUNTER — Encounter (HOSPITAL_COMMUNITY): Payer: Self-pay

## 2023-07-09 VITALS — BP 168/102 | HR 76 | Temp 98.7°F | Resp 16 | Ht 72.0 in | Wt 251.0 lb

## 2023-07-09 DIAGNOSIS — Z01818 Encounter for other preprocedural examination: Secondary | ICD-10-CM | POA: Insufficient documentation

## 2023-07-09 DIAGNOSIS — Z01812 Encounter for preprocedural laboratory examination: Secondary | ICD-10-CM | POA: Diagnosis not present

## 2023-07-09 DIAGNOSIS — Z01810 Encounter for preprocedural cardiovascular examination: Secondary | ICD-10-CM | POA: Diagnosis not present

## 2023-07-09 HISTORY — DX: Prediabetes: R73.03

## 2023-07-09 LAB — CBC
HCT: 34.8 % — ABNORMAL LOW (ref 39.0–52.0)
Hemoglobin: 11.2 g/dL — ABNORMAL LOW (ref 13.0–17.0)
MCH: 27.7 pg (ref 26.0–34.0)
MCHC: 32.2 g/dL (ref 30.0–36.0)
MCV: 86.1 fL (ref 80.0–100.0)
Platelets: 235 10*3/uL (ref 150–400)
RBC: 4.04 MIL/uL — ABNORMAL LOW (ref 4.22–5.81)
RDW: 14.2 % (ref 11.5–15.5)
WBC: 4.1 10*3/uL (ref 4.0–10.5)
nRBC: 0 % (ref 0.0–0.2)

## 2023-07-09 LAB — BASIC METABOLIC PANEL
Anion gap: 11 (ref 5–15)
BUN: 13 mg/dL (ref 6–20)
CO2: 23 mmol/L (ref 22–32)
Calcium: 8.9 mg/dL (ref 8.9–10.3)
Chloride: 102 mmol/L (ref 98–111)
Creatinine, Ser: 0.76 mg/dL (ref 0.61–1.24)
GFR, Estimated: >60 mL/min (ref >60)
Glucose, Bld: 130 mg/dL — ABNORMAL HIGH (ref 70–99)
Potassium: 4.4 mmol/L (ref 3.5–5.1)
Sodium: 136 mmol/L (ref 135–145)

## 2023-07-10 ENCOUNTER — Other Ambulatory Visit: Payer: Self-pay

## 2023-07-10 ENCOUNTER — Ambulatory Visit (HOSPITAL_COMMUNITY)
Admission: RE | Admit: 2023-07-10 | Discharge: 2023-07-10 | Disposition: A | Discharge status: Home / Self Care | Payer: 59 | Attending: Surgery | Admitting: Surgery

## 2023-07-10 ENCOUNTER — Ambulatory Visit (HOSPITAL_COMMUNITY): Payer: 59 | Admitting: Registered Nurse

## 2023-07-10 ENCOUNTER — Ambulatory Visit (HOSPITAL_BASED_OUTPATIENT_CLINIC_OR_DEPARTMENT_OTHER): Payer: 59 | Admitting: Registered Nurse

## 2023-07-10 ENCOUNTER — Encounter (HOSPITAL_COMMUNITY)
Admission: RE | Disposition: A | Discharge status: Home / Self Care | Payer: Self-pay | Source: Home / Self Care | Attending: Surgery

## 2023-07-10 ENCOUNTER — Encounter (HOSPITAL_COMMUNITY): Payer: Self-pay | Admitting: Surgery

## 2023-07-10 DIAGNOSIS — K429 Umbilical hernia without obstruction or gangrene: Secondary | ICD-10-CM

## 2023-07-10 DIAGNOSIS — K432 Incisional hernia without obstruction or gangrene: Secondary | ICD-10-CM | POA: Diagnosis present

## 2023-07-10 DIAGNOSIS — Z86711 Personal history of pulmonary embolism: Secondary | ICD-10-CM | POA: Insufficient documentation

## 2023-07-10 DIAGNOSIS — I10 Essential (primary) hypertension: Secondary | ICD-10-CM | POA: Insufficient documentation

## 2023-07-10 DIAGNOSIS — M6208 Separation of muscle (nontraumatic), other site: Secondary | ICD-10-CM | POA: Diagnosis present

## 2023-07-10 DIAGNOSIS — K4020 Bilateral inguinal hernia, without obstruction or gangrene, not specified as recurrent: Secondary | ICD-10-CM | POA: Diagnosis not present

## 2023-07-10 DIAGNOSIS — Z01818 Encounter for other preprocedural examination: Secondary | ICD-10-CM

## 2023-07-10 DIAGNOSIS — K219 Gastro-esophageal reflux disease without esophagitis: Secondary | ICD-10-CM | POA: Diagnosis not present

## 2023-07-10 DIAGNOSIS — E119 Type 2 diabetes mellitus without complications: Secondary | ICD-10-CM | POA: Insufficient documentation

## 2023-07-10 DIAGNOSIS — Z7984 Long term (current) use of oral hypoglycemic drugs: Secondary | ICD-10-CM | POA: Insufficient documentation

## 2023-07-10 DIAGNOSIS — Z86718 Personal history of other venous thrombosis and embolism: Secondary | ICD-10-CM | POA: Insufficient documentation

## 2023-07-10 DIAGNOSIS — G4730 Sleep apnea, unspecified: Secondary | ICD-10-CM | POA: Insufficient documentation

## 2023-07-10 DIAGNOSIS — Z9079 Acquired absence of other genital organ(s): Secondary | ICD-10-CM | POA: Insufficient documentation

## 2023-07-10 HISTORY — PX: UMBILICAL HERNIA REPAIR: SHX196

## 2023-07-10 LAB — GLUCOSE, CAPILLARY: Glucose-Capillary: 125 mg/dL — ABNORMAL HIGH (ref 70–99)

## 2023-07-10 SURGERY — REPAIR, HERNIA, UMBILICAL, ADULT
Anesthesia: General

## 2023-07-10 MED ORDER — DEXMEDETOMIDINE HCL IN NACL 80 MCG/20ML IV SOLN
INTRAVENOUS | Status: DC | PRN
Start: 2023-07-10 — End: 2023-07-10
  Administered 2023-07-10: 8 ug via INTRAVENOUS

## 2023-07-10 MED ORDER — HYDROMORPHONE HCL 1 MG/ML IJ SOLN
0.2500 mg | INTRAMUSCULAR | Status: DC | PRN
  Administered 2023-07-10 (×4): 0.5 mg via INTRAVENOUS

## 2023-07-10 MED ORDER — PROPOFOL 10 MG/ML IV BOLUS
INTRAVENOUS | Status: AC
  Filled 2023-07-10: qty 20

## 2023-07-10 MED ORDER — SUGAMMADEX SODIUM 200 MG/2ML IV SOLN
INTRAVENOUS | Status: DC | PRN
Start: 2023-07-10 — End: 2023-07-10
  Administered 2023-07-10: 200 mg via INTRAVENOUS

## 2023-07-10 MED ORDER — KETOROLAC TROMETHAMINE 30 MG/ML IJ SOLN
INTRAMUSCULAR | Status: AC
  Filled 2023-07-10: qty 1

## 2023-07-10 MED ORDER — 0.9 % SODIUM CHLORIDE (POUR BTL) OPTIME
TOPICAL | Status: DC | PRN
  Administered 2023-07-10: 1000 mL

## 2023-07-10 MED ORDER — TRAMADOL HCL 50 MG PO TABS: 50.0000 mg | ORAL_TABLET | Freq: Four times a day (QID) | ORAL | 0 refills | Status: AC | PRN

## 2023-07-10 MED ORDER — MIDAZOLAM HCL 2 MG/2ML IJ SOLN
INTRAMUSCULAR | Status: AC
  Filled 2023-07-10: qty 2

## 2023-07-10 MED ORDER — HYDRALAZINE HCL 20 MG/ML IJ SOLN
INTRAMUSCULAR | Status: AC
  Filled 2023-07-10: qty 1

## 2023-07-10 MED ORDER — SODIUM CHLORIDE 0.9 % IV SOLN: 12.5000 mg | INTRAVENOUS | Status: DC | PRN

## 2023-07-10 MED ORDER — PHENYLEPHRINE HCL (PRESSORS) 10 MG/ML IV SOLN
INTRAVENOUS | Status: DC | PRN
Start: 2023-07-10 — End: 2023-07-10
  Administered 2023-07-10: 80 ug via INTRAVENOUS

## 2023-07-10 MED ORDER — ROCURONIUM BROMIDE 10 MG/ML (PF) SYRINGE
PREFILLED_SYRINGE | INTRAVENOUS | Status: AC
  Filled 2023-07-10: qty 10

## 2023-07-10 MED ORDER — HYDRALAZINE HCL 20 MG/ML IJ SOLN
10.0000 mg | Freq: Once | INTRAMUSCULAR | Status: AC
  Administered 2023-07-10: 10 mg via INTRAVENOUS

## 2023-07-10 MED ORDER — CHLORHEXIDINE GLUCONATE CLOTH 2 % EX PADS: 6.0000 | MEDICATED_PAD | Freq: Once | CUTANEOUS | Status: DC

## 2023-07-10 MED ORDER — BUPIVACAINE LIPOSOME 1.3 % IJ SUSP
INTRAMUSCULAR | Status: DC | PRN
  Administered 2023-07-10: 40 mL

## 2023-07-10 MED ORDER — DEXAMETHASONE SODIUM PHOSPHATE 10 MG/ML IJ SOLN
INTRAMUSCULAR | Status: AC
  Filled 2023-07-10: qty 1

## 2023-07-10 MED ORDER — BUPIVACAINE LIPOSOME 1.3 % IJ SUSP
INTRAMUSCULAR | Status: AC
  Filled 2023-07-10: qty 20

## 2023-07-10 MED ORDER — BUPIVACAINE HCL (PF) 0.25 % IJ SOLN
INTRAMUSCULAR | Status: AC
  Filled 2023-07-10: qty 30

## 2023-07-10 MED ORDER — ROCURONIUM BROMIDE 100 MG/10ML IV SOLN
INTRAVENOUS | Status: DC | PRN
  Administered 2023-07-10 (×2): 10 mg via INTRAVENOUS
  Administered 2023-07-10: 60 mg via INTRAVENOUS

## 2023-07-10 MED ORDER — EPHEDRINE SULFATE (PRESSORS) 50 MG/ML IJ SOLN
INTRAMUSCULAR | Status: DC | PRN
  Administered 2023-07-10: 5 mg via INTRAVENOUS
  Administered 2023-07-10 (×2): 2.5 mg via INTRAVENOUS
  Administered 2023-07-10: 5 mg via INTRAVENOUS

## 2023-07-10 MED ORDER — ORAL CARE MOUTH RINSE: 15.0000 mL | Freq: Once | OROMUCOSAL | Status: AC

## 2023-07-10 MED ORDER — EPHEDRINE 5 MG/ML INJ
INTRAVENOUS | Status: AC
  Filled 2023-07-10: qty 5

## 2023-07-10 MED ORDER — PROPOFOL 10 MG/ML IV BOLUS
INTRAVENOUS | Status: DC | PRN
  Administered 2023-07-10: 50 mg via INTRAVENOUS
  Administered 2023-07-10: 250 mg via INTRAVENOUS

## 2023-07-10 MED ORDER — HYDROMORPHONE HCL 1 MG/ML IJ SOLN
INTRAMUSCULAR | Status: AC
  Filled 2023-07-10: qty 1

## 2023-07-10 MED ORDER — KETOROLAC TROMETHAMINE 30 MG/ML IJ SOLN
30.0000 mg | Freq: Once | INTRAMUSCULAR | Status: AC | PRN
  Administered 2023-07-10: 30 mg via INTRAVENOUS

## 2023-07-10 MED ORDER — MIDAZOLAM HCL 5 MG/5ML IJ SOLN
INTRAMUSCULAR | Status: DC | PRN
  Administered 2023-07-10: 2 mg via INTRAVENOUS

## 2023-07-10 MED ORDER — ONDANSETRON HCL 4 MG/2ML IJ SOLN
INTRAMUSCULAR | Status: DC | PRN
  Administered 2023-07-10: 4 mg via INTRAVENOUS

## 2023-07-10 MED ORDER — LACTATED RINGERS IV SOLN: INTRAVENOUS | Status: DC

## 2023-07-10 MED ORDER — STERILE WATER FOR IRRIGATION IR SOLN
Status: DC | PRN
  Administered 2023-07-10: 1000 mL

## 2023-07-10 MED ORDER — LIDOCAINE HCL (PF) 2 % IJ SOLN
INTRAMUSCULAR | Status: AC
  Filled 2023-07-10: qty 5

## 2023-07-10 MED ORDER — CEFAZOLIN SODIUM-DEXTROSE 2-4 GM/100ML-% IV SOLN
2.0000 g | INTRAVENOUS | Status: AC
  Administered 2023-07-10: 2 g via INTRAVENOUS
  Filled 2023-07-10: qty 100

## 2023-07-10 MED ORDER — ONDANSETRON HCL 4 MG/2ML IJ SOLN
INTRAMUSCULAR | Status: AC
  Filled 2023-07-10: qty 2

## 2023-07-10 MED ORDER — MEPERIDINE HCL 50 MG/ML IJ SOLN: 6.2500 mg | INTRAMUSCULAR | Status: DC | PRN

## 2023-07-10 MED ORDER — FENTANYL CITRATE (PF) 100 MCG/2ML IJ SOLN
INTRAMUSCULAR | Status: DC | PRN
  Administered 2023-07-10 (×2): 50 ug via INTRAVENOUS
  Administered 2023-07-10: 150 ug via INTRAVENOUS

## 2023-07-10 MED ORDER — DEXMEDETOMIDINE HCL IN NACL 80 MCG/20ML IV SOLN
INTRAVENOUS | Status: AC
  Filled 2023-07-10: qty 20

## 2023-07-10 MED ORDER — LIDOCAINE HCL (CARDIAC) PF 100 MG/5ML IV SOSY
PREFILLED_SYRINGE | INTRAVENOUS | Status: DC | PRN
  Administered 2023-07-10: 100 mg via INTRAVENOUS

## 2023-07-10 MED ORDER — PHENYLEPHRINE 80 MCG/ML (10ML) SYRINGE FOR IV PUSH (FOR BLOOD PRESSURE SUPPORT)
PREFILLED_SYRINGE | INTRAVENOUS | Status: AC
  Filled 2023-07-10: qty 10

## 2023-07-10 MED ORDER — FENTANYL CITRATE (PF) 250 MCG/5ML IJ SOLN
INTRAMUSCULAR | Status: AC
  Filled 2023-07-10: qty 5

## 2023-07-10 MED ORDER — GLYCOPYRROLATE 0.2 MG/ML IJ SOLN
INTRAMUSCULAR | Status: AC
  Filled 2023-07-10: qty 1

## 2023-07-10 MED ORDER — CHLORHEXIDINE GLUCONATE 0.12 % MT SOLN
15.0000 mL | Freq: Once | OROMUCOSAL | Status: AC
Start: 2023-07-10 — End: 2023-07-10
  Administered 2023-07-10: 15 mL via OROMUCOSAL

## 2023-07-10 SURGICAL SUPPLY — 29 items
BAG COUNTER SPONGE SURGICOUNT (BAG) IMPLANT
BLADE HEX COATED 2.75 (ELECTRODE) IMPLANT
CHLORAPREP W/TINT 26 (MISCELLANEOUS) ×2 IMPLANT
COVER SURGICAL LIGHT HANDLE (MISCELLANEOUS) ×1 IMPLANT
DERMABOND ADVANCED .7 DNX12 (GAUZE/BANDAGES/DRESSINGS) IMPLANT
DRAPE LAPAROSCOPIC ABDOMINAL (DRAPES) ×1 IMPLANT
ELECT REM PT RETURN 15FT ADLT (MISCELLANEOUS) ×1 IMPLANT
GAUZE SPONGE 4X4 12PLY STRL (GAUZE/BANDAGES/DRESSINGS) IMPLANT
GLOVE SURG ORTHO 8.0 STRL STRW (GLOVE) ×1 IMPLANT
GLOVE SURG SYN 7.5 E (GLOVE) ×1
GLOVE SURG SYN 7.5 PF PI (GLOVE) ×1 IMPLANT
GOWN STRL REUS W/ TWL XL LVL3 (GOWN DISPOSABLE) ×2 IMPLANT
GOWN STRL REUS W/TWL XL LVL3 (GOWN DISPOSABLE) ×2
KIT BASIN OR (CUSTOM PROCEDURE TRAY) ×1 IMPLANT
KIT TURNOVER KIT A (KITS) IMPLANT
MARKER SKIN DUAL TIP RULER LAB (MISCELLANEOUS) ×1 IMPLANT
MESH VENTRALEX ST 8CM LRG (Mesh General) IMPLANT
NDL HYPO 22X1.5 SAFETY MO (MISCELLANEOUS) ×1 IMPLANT
NEEDLE HYPO 22X1.5 SAFETY MO (MISCELLANEOUS) ×1
PACK GENERAL/GYN (CUSTOM PROCEDURE TRAY) ×1 IMPLANT
SPIKE FLUID TRANSFER (MISCELLANEOUS) ×1 IMPLANT
STRIP CLOSURE SKIN 1/2X4 (GAUZE/BANDAGES/DRESSINGS) IMPLANT
SUT MNCRL AB 4-0 PS2 18 (SUTURE) ×1 IMPLANT
SUT NOVA NAB GS-21 0 18 T12 DT (SUTURE) IMPLANT
SUT SILK 2 0 SH (SUTURE) IMPLANT
SUT VIC AB 3-0 SH 18 (SUTURE) ×1 IMPLANT
SYR 20ML LL LF (SYRINGE) ×1 IMPLANT
TOWEL OR 17X26 10 PK STRL BLUE (TOWEL DISPOSABLE) ×1 IMPLANT
TOWEL OR NON WOVEN STRL DISP B (DISPOSABLE) ×1 IMPLANT

## 2023-07-10 NOTE — Interval H&P Note (Signed)
History and Physical Interval Note:  07/10/2023 9:09 AM  Andrew Gaines  has presented today for surgery, with the diagnosis of UMBILICAL AND INCISIONAL HERNIA.  The various methods of treatment have been discussed with the patient and family. After consideration of risks, benefits and other options for treatment, the patient has consented to    Procedure(s): HERNIA REPAIR UMBILICAL  AND INCISIONAL WITH MESH (N/A) as a surgical intervention.    The patient's history has been reviewed, patient examined, no change in status, stable for surgery.  I have reviewed the patient's chart and labs.  Questions were answered to the patient's satisfaction.    Darnell Level, MD [facility] practice Office: [phone]   Darnell Level

## 2023-07-10 NOTE — Transfer of Care (Signed)
Immediate Anesthesia Transfer of Care Note  Patient: Andrew Gaines  Procedure(s) Performed: HERNIA REPAIR UMBILICAL  AND INCISIONAL WITH MESH  Patient Location: PACU  Anesthesia Type:General  Level of Consciousness: awake, alert , and oriented  Airway & Oxygen Therapy: Patient Spontanous Breathing and Patient connected to face mask oxygen  Post-op Assessment: Report given to RN and Post -op Vital signs reviewed and stable  Post vital signs: Reviewed and stable  Last Vitals:  Vitals Value Taken Time  BP    Temp    Pulse 77 07/10/23 1115  Resp 18 07/10/23 1115  SpO2 99 % 07/10/23 1115  Vitals shown include unfiled device data.  Last Pain:  Vitals:   07/10/23 0755  TempSrc: Oral  PainSc: 0-No pain         Complications: There were no known notable events for this encounter.

## 2023-07-10 NOTE — Discharge Instructions (Addendum)
[facility]  HERNIA REPAIR POST OP INSTRUCTIONS  Always review your discharge instruction sheet given to you by the facility where your surgery was performed.  A  prescription for pain medication may be sent to your pharmacy on discharge.  Take your pain medication as prescribed.  If narcotic pain medicine is not needed, then you may take acetaminophen (Tylenol) or ibuprofen (Advil) as needed.  Take your usually prescribed medications unless otherwise directed.  If you need a refill on your pain medication, please contact your pharmacy.  They will contact our office to request authorization. Prescriptions will not be filled after 5:00 PM daily or on weekends.  You should follow a light diet the first 24 hours after arrival home, such as soup and crackers or toast.  Be sure to include plenty of fluids daily.  Resume your normal diet the day after surgery.  Most patients will experience some swelling and bruising around the surgical site.  Ice packs and reclining will help.  Swelling and bruising can take several days to resolve.   It is common to experience some constipation if taking pain medication after surgery.  Increasing fluid intake and taking a stool softener (such as Colace) will usually help or prevent this problem from occurring.  A mild laxative (Milk of Magnesia or Miralax) should be taken according to package directions if there is no bowel movement after 48 hours.  You will likely have Dermabond (topical glue) over your incisions.  This seals the incisions and allows you to bathe and shower at any time after your surgery.  Glue should remain in place for up to 10 days.  It may be removed after 10 days by pealing off the Dermabond material or using Vaseline or naval jelly to remove.  ACTIVITIES:  You may resume regular (light) daily activities beginning the next day - such as daily self-care, walking, climbing stairs - gradually increasing activities as tolerated.  You  may have sexual intercourse when it is comfortable.  Refrain from any heavy lifting or straining until approved by your doctor.  You may drive when you are no longer taking prescription pain medication, when you can comfortably wear a seatbelt, and when you can safely maneuver your car and apply the brakes.  You should see your doctor in the office for a follow-up appointment approximately 2-3 weeks after your surgery.  Make sure that you call for this appointment within a day or two after you arrive home to insure a convenient appointment time.  WHEN TO CALL YOUR DOCTOR: Fever greater than 101.0 Inability to urinate Persistent nausea and/or vomiting Extreme swelling or bruising Continued bleeding from incision Increased pain, redness, or drainage from the incision  The clinic staff is available to answer your questions during regular business hours.  Please don't hesitate to call and ask to speak to one of the nurses for clinical concerns.  If you have a medical emergency, go to the nearest emergency room or call 911.  A surgeon from [facility] is always on call for the hospital.   [facility] [address], [address], [place], [place]  [address]  [phone] ? [phone] ? FAX [phone] 

## 2023-07-10 NOTE — Anesthesia Procedure Notes (Signed)
Procedure Name: Intubation Date/Time: 07/10/2023 9:38 AM  Performed by: Sharyn Dross, CRNAPre-anesthesia Checklist: Patient identified, Emergency Drugs available, Suction available and Patient being monitored Patient Re-evaluated:Patient Re-evaluated prior to induction Oxygen Delivery Method: Circle system utilized Preoxygenation: Pre-oxygenation with 100% oxygen Induction Type: IV induction Ventilation: Mask ventilation without difficulty and Oral airway inserted - appropriate to patient size Laryngoscope Size: Mac and 4 Grade View: Grade II Tube type: Oral Tube size: 7.5 mm Number of attempts: 1 Airway Equipment and Method: Stylet and Oral airway Placement Confirmation: ETT inserted through vocal cords under direct vision, positive ETCO2 and breath sounds checked- equal and bilateral Secured at: 23 cm Tube secured with: Tape Dental Injury: Teeth and Oropharynx as per pre-operative assessment

## 2023-07-10 NOTE — Anesthesia Preprocedure Evaluation (Signed)
Anesthesia Evaluation  Patient identified by MRN, date of birth, ID band Patient awake    History of Anesthesia Complications (+) PONV and history of anesthetic complications  Airway Mallampati: I       Dental no notable dental hx.    Pulmonary sleep apnea and Continuous Positive Airway Pressure Ventilation    Pulmonary exam normal        Cardiovascular hypertension, Pt. on medications Normal cardiovascular exam     Neuro/Psych  PSYCHIATRIC DISORDERS Anxiety Depression       GI/Hepatic   Endo/Other  diabetes, Type 2, Oral Hypoglycemic Agents    Renal/GU   negative genitourinary   Musculoskeletal negative musculoskeletal ROS (+)    Abdominal  (+) + obese  Peds  Hematology  (+) Blood dyscrasia, anemia   Anesthesia Other Findings   Reproductive/Obstetrics                             Anesthesia Physical Anesthesia Plan  ASA: 2  Anesthesia Plan: General   Post-op Pain Management:    Induction:   PONV Risk Score and Plan: 4 or greater and Ondansetron, Midazolam and Treatment may vary due to age or medical condition  Airway Management Planned: Oral ETT  Additional Equipment: None  Intra-op Plan:   Post-operative Plan: Extubation in OR  Informed Consent: I have reviewed the patients History and Physical, chart, labs and discussed the procedure including the risks, benefits and alternatives for the proposed anesthesia with the patient or authorized representative who has indicated his/her understanding and acceptance.     Dental advisory given  Plan Discussed with: CRNA  Anesthesia Plan Comments:        Anesthesia Quick Evaluation

## 2023-07-10 NOTE — Anesthesia Postprocedure Evaluation (Signed)
Anesthesia Post Note  Patient: Andrew Gaines  Procedure(s) Performed: HERNIA REPAIR UMBILICAL  AND INCISIONAL WITH MESH     Patient location during evaluation: PACU Anesthesia Type: General Level of consciousness: awake and sedated Pain management: pain level controlled Vital Signs Assessment: post-procedure vital signs reviewed and stable Respiratory status: spontaneous breathing Cardiovascular status: stable Postop Assessment: no apparent nausea or vomiting Anesthetic complications: no  There were no known notable events for this encounter.  Last Vitals:  Vitals:   07/10/23 1145 07/10/23 1200  BP: (!) 154/89 (!) 154/95  Pulse: 81 67  Resp: 15 (!) 8  Temp:    SpO2: 96% 93%    Last Pain:  Vitals:   07/10/23 1200  TempSrc:   PainSc: 4                  [employer]

## 2023-07-10 NOTE — Op Note (Signed)
Operative Note  Pre-operative Diagnosis:  umbilical and incisional hernia  Post-operative Diagnosis:  same  Surgeon:  Darnell Level, MD  Assistant:  none   Procedure:  open repair umbilical and incisional hernia with Bard ST mesh patch (defect total 8.0 cm cephalad to caudad)  Anesthesia:  general  Estimated Blood Loss:  20 cc  Drains: none         Specimen: none  Indications:  Patient is referred by Dr. Abbe Amsterdam for surgical evaluation and management of a known umbilical hernia and probable incisional hernia. Patient developed a hernia following prostatectomy 9 years ago. Patient first noted a bulge at the umbilicus shortly after his prostate procedure. Over the years this is gradually increased in size. Patient has developed a second bulge just slightly cephalad and to the left of the umbilicus. This causes intermittent mild discomfort with physical activity. Patient presents today to discuss surgical repair.   Procedure:  The patient was seen in the pre-op holding area. The risks, benefits, complications, treatment options, and expected outcomes were previously discussed with the patient. The patient agreed with the proposed plan and has signed the informed consent form.  The patient was brought to the operating room by the surgical team, identified as Archer Asa and the procedure verified. A "time out" was completed and the above information confirmed.  Following induction of general anesthesia, the patient is positioned and then prepped and draped in the usual aseptic fashion.  After ascertaining that an adequate level of anesthesia been achieved, an incision is made in the midline above the level of the umbilicus.  Dissection is carried in the subcutaneous tissues and hernia sac is identified.  Multiple small hernia sacs were dissected out.  There was a moderate sized hernia sac posterior to the umbilicus.  This was dissected off of the underside of the umbilical skin and  dissection carried down to the fascia.  Hernia sac was opened.  There was incarcerated omentum.  This was reduced back within the peritoneal cavity and the umbilicus was completely mobilized off the anterior fascia.  Palpation through the fascial defect revealed multiple defects in the midline superior to the umbilicus.  Fascial bridges were divided allowing for dissection of the various small hernia sacs and reduction of the incarcerated preperitoneal fat and omentum.  The largest defect was several centimeters cephalad and contained a moderate amount of incarcerated omentum which was dissected free and reduced within the peritoneal cavity.  Fascial edges were then dissected out circumferentially.  The result was a single fascial defect which measured 8 cm cephalad to caudad.  A Bard ST mesh patch was selected.  It was placed beneath the fascia and secured to the fascia with an interrupted closure of the fascial edges with 0 Novafil sutures.  Subcutaneous tissues were released circumferentially allowing for there to be less tension on the underlying fascia.  The umbilicus was reaffixed to the abdominal wall with an interrupted 0 Novafil suture.  Local field block was placed in the fascia with a mix of Marcaine and Exparel.  Subcutaneous tissues the rethink closed with interrupted 3-0 Vicryl sutures.  2 layers of 3-0 Vicryl suture was required to close the subcutaneous tissues.  Skin was then anesthetized with a mix of Marcaine and Exparel.  Skin edges were reapproximated with a running 4-0 Monocryl subcuticular suture.  Wound was washed and dried and Dermabond was applied as dressing.  Patient was awakened from anesthesia and transferred to the recovery room in stable  condition.  The patient tolerated the procedure well.   Darnell Level, MD [facility] Surgery Office: [phone]

## 2023-07-11 ENCOUNTER — Encounter (HOSPITAL_COMMUNITY): Payer: Self-pay | Admitting: Surgery

## 2023-07-23 DIAGNOSIS — M1712 Unilateral primary osteoarthritis, left knee: Secondary | ICD-10-CM | POA: Diagnosis not present

## 2023-08-09 DIAGNOSIS — G8918 Other acute postprocedural pain: Secondary | ICD-10-CM | POA: Diagnosis not present

## 2023-08-09 DIAGNOSIS — Z96652 Presence of left artificial knee joint: Secondary | ICD-10-CM | POA: Diagnosis not present

## 2023-08-09 DIAGNOSIS — M1712 Unilateral primary osteoarthritis, left knee: Secondary | ICD-10-CM | POA: Diagnosis not present

## 2023-08-13 ENCOUNTER — Encounter (HOSPITAL_BASED_OUTPATIENT_CLINIC_OR_DEPARTMENT_OTHER): Payer: 59

## 2023-08-13 ENCOUNTER — Other Ambulatory Visit (HOSPITAL_BASED_OUTPATIENT_CLINIC_OR_DEPARTMENT_OTHER): Payer: Self-pay | Admitting: Orthopaedic Surgery

## 2023-08-13 DIAGNOSIS — R531 Weakness: Secondary | ICD-10-CM | POA: Diagnosis not present

## 2023-08-13 DIAGNOSIS — M25662 Stiffness of left knee, not elsewhere classified: Secondary | ICD-10-CM | POA: Diagnosis not present

## 2023-08-13 DIAGNOSIS — M79662 Pain in left lower leg: Secondary | ICD-10-CM

## 2023-08-13 DIAGNOSIS — Z96652 Presence of left artificial knee joint: Secondary | ICD-10-CM | POA: Diagnosis not present

## 2023-08-13 DIAGNOSIS — R262 Difficulty in walking, not elsewhere classified: Secondary | ICD-10-CM | POA: Diagnosis not present

## 2023-08-24 DIAGNOSIS — M25662 Stiffness of left knee, not elsewhere classified: Secondary | ICD-10-CM | POA: Diagnosis not present

## 2023-08-30 ENCOUNTER — Other Ambulatory Visit: Payer: Self-pay | Admitting: Family Medicine

## 2023-08-30 DIAGNOSIS — F339 Major depressive disorder, recurrent, unspecified: Secondary | ICD-10-CM

## 2023-10-04 DIAGNOSIS — Z96652 Presence of left artificial knee joint: Secondary | ICD-10-CM | POA: Diagnosis not present

## 2023-10-04 DIAGNOSIS — R531 Weakness: Secondary | ICD-10-CM | POA: Diagnosis not present

## 2023-10-04 DIAGNOSIS — M25662 Stiffness of left knee, not elsewhere classified: Secondary | ICD-10-CM | POA: Diagnosis not present

## 2023-10-04 DIAGNOSIS — R262 Difficulty in walking, not elsewhere classified: Secondary | ICD-10-CM | POA: Diagnosis not present

## 2023-10-19 DIAGNOSIS — M1712 Unilateral primary osteoarthritis, left knee: Secondary | ICD-10-CM | POA: Diagnosis not present

## 2023-12-18 DIAGNOSIS — G4733 Obstructive sleep apnea (adult) (pediatric): Secondary | ICD-10-CM | POA: Diagnosis not present

## 2023-12-28 LAB — HM DIABETES EYE EXAM

## 2024-01-13 DIAGNOSIS — Z791 Long term (current) use of non-steroidal anti-inflammatories (NSAID): Secondary | ICD-10-CM | POA: Diagnosis not present

## 2024-01-13 DIAGNOSIS — E669 Obesity, unspecified: Secondary | ICD-10-CM | POA: Diagnosis not present

## 2024-01-13 DIAGNOSIS — Z86718 Personal history of other venous thrombosis and embolism: Secondary | ICD-10-CM | POA: Diagnosis not present

## 2024-01-13 DIAGNOSIS — Z6833 Body mass index (BMI) 33.0-33.9, adult: Secondary | ICD-10-CM | POA: Diagnosis not present

## 2024-01-13 DIAGNOSIS — I10 Essential (primary) hypertension: Secondary | ICD-10-CM | POA: Diagnosis not present

## 2024-01-13 DIAGNOSIS — M1990 Unspecified osteoarthritis, unspecified site: Secondary | ICD-10-CM | POA: Diagnosis not present

## 2024-01-13 DIAGNOSIS — Z8249 Family history of ischemic heart disease and other diseases of the circulatory system: Secondary | ICD-10-CM | POA: Diagnosis not present

## 2024-01-13 DIAGNOSIS — Z7984 Long term (current) use of oral hypoglycemic drugs: Secondary | ICD-10-CM | POA: Diagnosis not present

## 2024-01-13 DIAGNOSIS — G4733 Obstructive sleep apnea (adult) (pediatric): Secondary | ICD-10-CM | POA: Diagnosis not present

## 2024-01-13 DIAGNOSIS — F325 Major depressive disorder, single episode, in full remission: Secondary | ICD-10-CM | POA: Diagnosis not present

## 2024-01-13 DIAGNOSIS — Z008 Encounter for other general examination: Secondary | ICD-10-CM | POA: Diagnosis not present

## 2024-01-13 DIAGNOSIS — Z8546 Personal history of malignant neoplasm of prostate: Secondary | ICD-10-CM | POA: Diagnosis not present

## 2024-01-13 DIAGNOSIS — E785 Hyperlipidemia, unspecified: Secondary | ICD-10-CM | POA: Diagnosis not present

## 2024-02-03 ENCOUNTER — Other Ambulatory Visit: Payer: Self-pay | Admitting: Family Medicine

## 2024-02-03 DIAGNOSIS — E119 Type 2 diabetes mellitus without complications: Secondary | ICD-10-CM

## 2024-02-03 DIAGNOSIS — I10 Essential (primary) hypertension: Secondary | ICD-10-CM

## 2024-02-03 DIAGNOSIS — E782 Mixed hyperlipidemia: Secondary | ICD-10-CM

## 2024-02-07 DIAGNOSIS — H2000 Unspecified acute and subacute iridocyclitis: Secondary | ICD-10-CM | POA: Diagnosis not present

## 2024-02-18 NOTE — Progress Notes (Signed)
 [facility] Quality Team Note  Name: Andrew Gaines Date of Birth: Jun 26, 1966 MRN: [id number] Date: 02/18/2024  [facility] Quality Team has reviewed this patient's chart, please see recommendations below:  [facility] Quality Other; (CHART REVIEWED, ABSTRACTED 2025 DIABETIC EYE EXAM)

## 2024-02-19 NOTE — Progress Notes (Signed)
 [facility] Quality Team Note  Name: Andrew Gaines Date of Birth: 1966/07/10 MRN: [id number] Date: 02/19/2024  [facility] Quality Team has reviewed this patient's chart, please see recommendations below:  [facility] Quality Other; (PATIENT HAS UPCOMING APPT 7/14, NEEDS URINE MICROALBUMIN/CREATININE RATIO AND EGFR COMPLETED FOR GAP CLOSURE)

## 2024-03-03 ENCOUNTER — Encounter: Payer: Self-pay | Admitting: Family Medicine

## 2024-03-03 ENCOUNTER — Ambulatory Visit: Admitting: Family Medicine

## 2024-03-03 ENCOUNTER — Telehealth: Payer: Self-pay

## 2024-03-03 VITALS — BP 158/86 | HR 73 | Temp 98.8°F | Ht 72.0 in | Wt 264.6 lb

## 2024-03-03 DIAGNOSIS — Z8546 Personal history of malignant neoplasm of prostate: Secondary | ICD-10-CM | POA: Diagnosis not present

## 2024-03-03 DIAGNOSIS — Z6835 Body mass index (BMI) 35.0-35.9, adult: Secondary | ICD-10-CM

## 2024-03-03 DIAGNOSIS — E559 Vitamin D deficiency, unspecified: Secondary | ICD-10-CM | POA: Diagnosis not present

## 2024-03-03 DIAGNOSIS — E782 Mixed hyperlipidemia: Secondary | ICD-10-CM

## 2024-03-03 DIAGNOSIS — Z0000 Encounter for general adult medical examination without abnormal findings: Secondary | ICD-10-CM | POA: Diagnosis not present

## 2024-03-03 DIAGNOSIS — F339 Major depressive disorder, recurrent, unspecified: Secondary | ICD-10-CM | POA: Diagnosis not present

## 2024-03-03 DIAGNOSIS — E66812 Obesity, class 2: Secondary | ICD-10-CM | POA: Diagnosis not present

## 2024-03-03 DIAGNOSIS — E119 Type 2 diabetes mellitus without complications: Secondary | ICD-10-CM | POA: Diagnosis not present

## 2024-03-03 DIAGNOSIS — Z818 Family history of other mental and behavioral disorders: Secondary | ICD-10-CM

## 2024-03-03 DIAGNOSIS — I10 Essential (primary) hypertension: Secondary | ICD-10-CM | POA: Diagnosis not present

## 2024-03-03 DIAGNOSIS — R413 Other amnesia: Secondary | ICD-10-CM

## 2024-03-03 LAB — COMPREHENSIVE METABOLIC PANEL WITH GFR
ALT: 27 U/L (ref 0–53)
AST: 20 U/L (ref 0–37)
Albumin: 4.3 g/dL (ref 3.5–5.2)
Alkaline Phosphatase: 60 U/L (ref 39–117)
BUN: 16 mg/dL (ref 6–23)
CO2: 27 meq/L (ref 19–32)
Calcium: 9.8 mg/dL (ref 8.4–10.5)
Chloride: 104 meq/L (ref 96–112)
Creatinine, Ser: 1.13 mg/dL (ref 0.40–1.50)
GFR: 71.07 mL/min (ref >60.00)
Glucose, Bld: 139 mg/dL — ABNORMAL HIGH (ref 70–99)
Potassium: 4.4 meq/L (ref 3.5–5.1)
Sodium: 139 meq/L (ref 135–145)
Total Bilirubin: 0.9 mg/dL (ref 0.2–1.2)
Total Protein: 6.8 g/dL (ref 6.0–8.3)

## 2024-03-03 LAB — LIPID PANEL
Cholesterol: 165 mg/dL (ref 0–200)
HDL: 67 mg/dL (ref >39.00)
LDL Cholesterol: 57 mg/dL (ref 0–99)
NonHDL: 98.28
Total CHOL/HDL Ratio: 2
Triglycerides: 207 mg/dL — ABNORMAL HIGH (ref 0.0–149.0)
VLDL: 41.4 mg/dL — ABNORMAL HIGH (ref 0.0–40.0)

## 2024-03-03 LAB — CBC WITH DIFFERENTIAL/PLATELET
Basophils Absolute: 0.1 K/uL (ref 0.0–0.1)
Basophils Relative: 1.7 % (ref 0.0–3.0)
Eosinophils Absolute: 0.4 K/uL (ref 0.0–0.7)
Eosinophils Relative: 7.3 % — ABNORMAL HIGH (ref 0.0–5.0)
HCT: 36.5 % — ABNORMAL LOW (ref 39.0–52.0)
Hemoglobin: 11.7 g/dL — ABNORMAL LOW (ref 13.0–17.0)
Lymphocytes Relative: 22.6 % (ref 12.0–46.0)
Lymphs Abs: 1.2 K/uL (ref 0.7–4.0)
MCHC: 32.2 g/dL (ref 30.0–36.0)
MCV: 82.7 fl (ref 78.0–100.0)
Monocytes Absolute: 0.5 K/uL (ref 0.1–1.0)
Monocytes Relative: 9 % (ref 3.0–12.0)
Neutro Abs: 3.3 K/uL (ref 1.4–7.7)
Neutrophils Relative %: 59.4 % (ref 43.0–77.0)
Platelets: 213 K/uL (ref 150.0–400.0)
RBC: 4.41 Mil/uL (ref 4.22–5.81)
RDW: 17.7 % — ABNORMAL HIGH (ref 11.5–15.5)
WBC: 5.5 K/uL (ref 4.0–10.5)

## 2024-03-03 LAB — MICROALBUMIN / CREATININE URINE RATIO
Creatinine,U: 114.9 mg/dL
Microalb Creat Ratio: 9.4 mg/g (ref 0.0–30.0)
Microalb, Ur: 1.1 mg/dL (ref 0.0–1.9)

## 2024-03-03 LAB — VITAMIN D 25 HYDROXY (VIT D DEFICIENCY, FRACTURES): VITD: 21.36 ng/mL — ABNORMAL LOW (ref 30.00–100.00)

## 2024-03-03 LAB — T4, FREE: Free T4: 0.89 ng/dL (ref 0.60–1.60)

## 2024-03-03 LAB — PSA: PSA: 0 ng/mL — ABNORMAL LOW (ref 0.10–4.00)

## 2024-03-03 LAB — VITAMIN B12: Vitamin B-12: 241 pg/mL (ref 211–911)

## 2024-03-03 LAB — TSH: TSH: 1.68 u[IU]/mL (ref 0.35–5.50)

## 2024-03-03 LAB — HEMOGLOBIN A1C: Hgb A1c MFr Bld: 7.2 % — ABNORMAL HIGH (ref 4.6–6.5)

## 2024-03-03 MED ORDER — AMLODIPINE BESYLATE 5 MG PO TABS: 5.0000 mg | ORAL_TABLET | Freq: Every day | ORAL | 1 refills | Status: DC

## 2024-03-03 MED ORDER — VITAMIN D (ERGOCALCIFEROL) 1.25 MG (50000 UNIT) PO CAPS: 50000.0000 [IU] | ORAL_CAPSULE | ORAL | 0 refills | Status: AC

## 2024-03-03 NOTE — Telephone Encounter (Unsigned)
 Copied from CRM [id number]. Topic: General - Other >> Mar 03, 2024  4:47 PM Jayma L wrote: Reason for CRM: patient stated he was seen today and medicine he couldn't remember was called gabapentin .

## 2024-03-03 NOTE — Progress Notes (Signed)
 Established Patient Office Visit   Subjective  Patient ID: Andrew Gaines, male    DOB: April 21, 1966  Age: 58 y.o. MRN: [id number]  Chief Complaint  Patient presents with   Annual Exam    Patient would like to talk about GLP1, depression, and Memory changes     Patient is a 58 year old male seen for CPE and several other concerns.  Patient endorses changes in memory.  States staff at work have noticed that he does not remember conversations or passwords.  Often has them repeat was just discussed.  Patient denies trouble with paying bills, getting lost when driving.  Patient does note an increase in depression symptoms.  Previously seen by psychiatry, Dr. Vincente.  On Lexapro  20 mg daily times several years.  Has not felt like it was done much.  Was started on another medication but states it side effects.  Does not recall the name.  Patient notes weight gain.  Previously seen by weight management.  States will make changes and lose about 10 pounds and gain it back.  Inquires about GLP-1 meds.  Not really exercising.  BP elevated.  Has not been checking at home.  Had left knee replaced and umbilical hernia repair.    Patient Active Problem List   Diagnosis Date Noted   Diastasis recti 03/13/2023   Incisional hernia, without obstruction or gangrene 03/13/2023   Umbilical hernia without obstruction and without gangrene 03/13/2023   Cough 08/08/2022   Hyperlipidemia 01/28/2016   Diabetes mellitus, new onset (HCC) 07/08/2015   Obesity, Class I, BMI 30-34.9 07/08/2015   Prostate cancer (HCC) 07/20/2014   Adderall use disorder, mild (HCC) 03/02/2014   Hyperlipidemia, unspecified 03/02/2014   Optic neuritis 10/30/2013   Gastro-esophageal reflux disease with esophagitis 01/29/2013   Difficulty swallowing solids 01/29/2013   Facial pain, acute 03/25/2012   Obstructive sleep apnea 12/15/2009   SNORING 12/14/2009   DVT 12/08/2009   Hypertension 11/22/2009   Other pulmonary embolism and  infarction 11/22/2009   Allergic rhinitis 11/22/2009   Past Medical History:  Diagnosis Date   Cancer (HCC) 06/2014   prostate cancer    Complication of anesthesia    n&v   DVT (deep venous thrombosis) (HCC)    and PE after knee surgery years ago   GERD (gastroesophageal reflux disease)    Hiatal hernia    PONV (postoperative nausea and vomiting)    Pre-diabetes    Sleep apnea    cpap   Unspecified essential hypertension    Unspecified sinusitis (chronic)    Past Surgical History:  Procedure Laterality Date   JOINT REPLACEMENT     bilateral knee    KNEE ARTHROSCOPY     bilateral    LYMPHADENECTOMY Bilateral 07/20/2014   Procedure: LYMPHADENECTOMY;  Surgeon: Gretel Ferrara, MD;  Location: [facility];  Service: Urology;  Laterality: Bilateral;   ROBOT ASSISTED LAPAROSCOPIC RADICAL PROSTATECTOMY N/A 07/20/2014   Procedure: ROBOTIC ASSISTED LAPAROSCOPIC RADICAL PROSTATECTOMY LEVEL 2;  Surgeon: Gretel Ferrara, MD;  Location: [facility];  Service: Urology;  Laterality: N/A;   SHOULDER ARTHROSCOPY     bilateral    thumb and elbow surgery      UMBILICAL HERNIA REPAIR N/A 07/10/2023   Procedure: HERNIA REPAIR UMBILICAL  AND INCISIONAL WITH MESH;  Surgeon: Eletha Boas, MD;  Location: [facility];  Service: General;  Laterality: N/A;   Social History   Tobacco Use   Smoking status: Never   Smokeless tobacco: Never  Vaping Use   Vaping status:  Never Used  Substance Use Topics   Alcohol use: Yes    Comment: social   Drug use: No   Family History  Problem Relation Age of Onset   Breast cancer Maternal Grandmother    Colon cancer Paternal Grandmother 8   Esophageal cancer Neg Hx    Rectal cancer Neg Hx    Stomach cancer Neg Hx    No Known Allergies  ROS Negative unless stated above    Objective:     BP (!) 160/84 (BP Location: Left Arm, Patient Position: Sitting, Cuff Size: Normal)   Pulse 73   Temp 98.8 F (37.1 C) (Oral)   Ht 6' (1.829 m)   Wt 264 lb 9.6 oz (120 kg)    SpO2 97%   BMI 35.89 kg/m  BP Readings from Last 3 Encounters:  03/03/24 (!) 160/84  07/10/23 (!) 161/97  07/09/23 (!) 168/102   Wt Readings from Last 3 Encounters:  03/03/24 264 lb 9.6 oz (120 kg)  07/10/23 251 lb (113.9 kg)  07/09/23 251 lb (113.9 kg)      Physical Exam Constitutional:      Appearance: Normal appearance.  HENT:     Head: Normocephalic and atraumatic.     Right Ear: Tympanic membrane, ear canal and external ear normal.     Left Ear: Tympanic membrane, ear canal and external ear normal.     Nose: Nose normal.     Mouth/Throat:     Mouth: Mucous membranes are moist.     Pharynx: No oropharyngeal exudate or posterior oropharyngeal erythema.  Eyes:     General: No scleral icterus.    Extraocular Movements: Extraocular movements intact.     Conjunctiva/sclera: Conjunctivae normal.     Pupils: Pupils are equal, round, and reactive to light.  Neck:     Thyroid : No thyromegaly.  Cardiovascular:     Rate and Rhythm: Normal rate and regular rhythm.     Pulses: Normal pulses.     Heart sounds: Normal heart sounds. No murmur heard.    No friction rub.  Pulmonary:     Effort: Pulmonary effort is normal.     Breath sounds: Normal breath sounds. No wheezing, rhonchi or rales.  Abdominal:     General: Bowel sounds are normal.     Palpations: Abdomen is soft.     Tenderness: There is no abdominal tenderness.  Musculoskeletal:        General: No deformity. Normal range of motion.  Lymphadenopathy:     Cervical: No cervical adenopathy.  Skin:    General: Skin is warm and dry.     Findings: No lesion.     Comments: Well-healed surgical incisions of bilateral knees.  Neurological:     General: No focal deficit present.     Mental Status: He is alert and oriented to person, place, and time.  Psychiatric:        Mood and Affect: Mood normal.        Thought Content: Thought content normal.        03/03/2024    1:51 PM 05/24/2023   11:04 AM 02/02/2023   10:58  AM  Depression screen PHQ 2/9  Decreased Interest 1  0  Down, Depressed, Hopeless 1  0  PHQ - 2 Score 2  0  Altered sleeping 1  0  Tired, decreased energy 0  1  Change in appetite 0 0 0  Feeling bad or failure about yourself  1 0 0  Trouble  concentrating 1 0 0  Moving slowly or fidgety/restless 1  0  Suicidal thoughts 0 0 0  PHQ-9 Score 6  1  Difficult doing work/chores  Not difficult at all Not difficult at all      03/03/2024    1:51 PM 05/24/2023   11:04 AM 02/02/2023   10:59 AM  GAD 7 : Generalized Anxiety Score  Nervous, Anxious, on Edge 0 0 0  Control/stop worrying 0 0 0  Worry too much - different things 0 0 0  Trouble relaxing 1 0 0  Restless 1 0 0  Easily annoyed or irritable 1 0 0  Afraid - awful might happen 0 0 0  Total GAD 7 Score 3 0 0  Anxiety Difficulty Not difficult at all Not difficult at all      No results found for any visits on 03/03/24.    Assessment & Plan:   Well adult exam -     CBC with Differential/Platelet; Future -     Comprehensive metabolic panel with GFR; Future -     Hemoglobin A1c; Future -     Lipid panel; Future -     TSH; Future -     T4, free; Future  Memory change  Type 2 diabetes mellitus without complication, without long-term current use of insulin (HCC) -     Microalbumin / creatinine urine ratio  Essential hypertension -     TSH; Future -     T4, free; Future -     amLODIPine  Besylate; Take 1 tablet (5 mg total) by mouth daily.  Dispense: 90 tablet; Refill: 1  Depression, recurrent (HCC) -     TSH; Future -     T4, free; Future  Mixed hyperlipidemia -     Lipid panel; Future  History of prostate cancer -     PSA; Future  Class 2 severe obesity with serious comorbidity and body mass index (BMI) of 35.0 to 35.9 in adult, unspecified obesity type (HCC) -     Comprehensive metabolic panel with GFR; Future -     TSH; Future -     Vitamin B12; Future -     VITAMIN D  25 Hydroxy (Vit-D Deficiency, Fractures);  Future -     T4, free; Future  Family history of dementia  Age-appropriate health screenings discussed.  Will obtain labs.  Colonoscopy done 06/29/2016.  Recall due 2027.  Immunizations reviewed.  Per patient recent A1c 6.1% during home visit.  Continue metformin .  Discussed the importance of lifestyle modifications including increasing physical activity.  Offered referral to weight management.  Patient declined.  Continue Lipitor 20 mg daily.  BP elevated.  Continue losartan  100 mg daily.  Will add Norvasc  5 mg to regimen.  Patient with subjective increase in depression symptoms.  Currently on Lexapro  20 mg daily.  Discussed augmenting medication with another versus weaning.   Patient deciding.  Encouraged to consider scheduling follow-up with previous psychiatry provider.  Obtain labs to further evaluate memory concerns.  Schedule follow-up in the next few weeks for memory testing and referral to neurology if needed.  Update:  Vit D level low at 21.36.  Ergocalciferol  sent to pharmacy.  Return in about 4 weeks (around 03/31/2024) for Memory.   Clotilda JONELLE Single, MD

## 2024-03-17 DIAGNOSIS — G4733 Obstructive sleep apnea (adult) (pediatric): Secondary | ICD-10-CM | POA: Diagnosis not present

## 2024-03-28 ENCOUNTER — Ambulatory Visit: Payer: Self-pay | Admitting: Family Medicine

## 2024-03-28 DIAGNOSIS — E559 Vitamin D deficiency, unspecified: Secondary | ICD-10-CM

## 2024-03-31 ENCOUNTER — Ambulatory Visit (INDEPENDENT_AMBULATORY_CARE_PROVIDER_SITE_OTHER): Admitting: Family Medicine

## 2024-03-31 VITALS — BP 148/82 | HR 75 | Temp 98.6°F | Ht 72.0 in | Wt 266.6 lb

## 2024-03-31 DIAGNOSIS — E119 Type 2 diabetes mellitus without complications: Secondary | ICD-10-CM

## 2024-03-31 DIAGNOSIS — R413 Other amnesia: Secondary | ICD-10-CM | POA: Diagnosis not present

## 2024-03-31 DIAGNOSIS — I10 Essential (primary) hypertension: Secondary | ICD-10-CM | POA: Diagnosis not present

## 2024-03-31 DIAGNOSIS — H5789 Other specified disorders of eye and adnexa: Secondary | ICD-10-CM

## 2024-03-31 DIAGNOSIS — Z7984 Long term (current) use of oral hypoglycemic drugs: Secondary | ICD-10-CM

## 2024-03-31 MED ORDER — AMLODIPINE BESYLATE 10 MG PO TABS: 10.0000 mg | ORAL_TABLET | Freq: Every day | ORAL | 3 refills | Status: DC

## 2024-03-31 NOTE — Patient Instructions (Signed)
 Your urine microalbumin creatinine ratio was normal when last checked in July.

## 2024-03-31 NOTE — Progress Notes (Signed)
 Established Patient Office Visit   Subjective  Patient ID: Andrew Gaines, male    DOB: [birth year]  Age: 58 y.o. MRN: [id number]  Chief Complaint  Patient presents with  . Medical Management of Chronic Issues    Memory and blood pressure     Pt is a 58 yo male seen for f/u.  Pt states bp still elevated.  Has log of readings, 142/84, 145/88, 154/95, 143/88, 161/97, 153/93, 138/81, 139/78, 167/92, 177/95, 141/81, 161/88.  Takes meds around 6 am, checks bp at various times during the day after that.  Pt denies HA, SOB, CP, changes in vision.    Pt returns for memory testing.  Still having trouble remembering small things at work.  Co-workers remind him of tasks they just discussed.  Denies difficulty with memory outside of work.  Pt has a Bachelor's degree.  Pt with irritation of L eye.  Unsure if he touched a chemical at work, then touched his face.  Flushed eye with water  prior to appt.  Feels like something is in it/needs to rub it.  Denies changes in vision or gritty feeling.      Patient Active Problem List   Diagnosis Date Noted  . Diastasis recti 03/13/2023  . Incisional hernia, without obstruction or gangrene 03/13/2023  . Umbilical hernia without obstruction and without gangrene 03/13/2023  . Cough 08/08/2022  . Hyperlipidemia 01/28/2016  . Diabetes mellitus, new onset (HCC) 07/08/2015  . Obesity, Class I, BMI 30-34.9 07/08/2015  . Prostate cancer (HCC) 07/20/2014  . Adderall use disorder, mild (HCC) 03/02/2014  . Hyperlipidemia, unspecified 03/02/2014  . Optic neuritis 10/30/2013  . Gastro-esophageal reflux disease with esophagitis 01/29/2013  . Difficulty swallowing solids 01/29/2013  . Facial pain, acute 03/25/2012  . Obstructive sleep apnea 12/15/2009  . SNORING 12/14/2009  . DVT 12/08/2009  . Hypertension 11/22/2009  . 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  . Allergic rhinitis 11/22/2009   Past Medical History:  Diagnosis Date  . Cancer (HCC)  06/2014   prostate cancer   . Complication of anesthesia    n&v  . DVT (deep venous thrombosis) (HCC)    and PE after knee surgery years ago  . GERD (gastroesophageal reflux disease)   . Hiatal hernia   . PONV (postoperative nausea and vomiting)   . Pre-diabetes   . Sleep apnea    cpap  . Unspecified essential hypertension   . Unspecified sinusitis (chronic)    Past Surgical History:  Procedure Laterality Date  . JOINT REPLACEMENT     bilateral knee   . KNEE ARTHROSCOPY     bilateral   . LYMPHADENECTOMY Bilateral 07/20/2014   Procedure: LYMPHADENECTOMY;  Surgeon: Gretel Ferrara, MD;  Location: [facility];  Service: Urology;  Laterality: Bilateral;  . ROBOT ASSISTED LAPAROSCOPIC RADICAL PROSTATECTOMY N/A 07/20/2014   Procedure: ROBOTIC ASSISTED LAPAROSCOPIC RADICAL PROSTATECTOMY LEVEL 2;  Surgeon: Gretel Ferrara, MD;  Location: [facility];  Service: Urology;  Laterality: N/A;  . SHOULDER ARTHROSCOPY     bilateral   . thumb and elbow surgery     . UMBILICAL HERNIA REPAIR N/A 07/10/2023   Procedure: HERNIA REPAIR UMBILICAL  AND INCISIONAL WITH MESH;  Surgeon: Eletha Boas, MD;  Location: [facility];  Service: General;  Laterality: N/A;   Social History   Tobacco Use  . Smoking status: Never  . Smokeless tobacco: Never  Vaping Use  . Vaping status: Never Used  Substance Use Topics  . Alcohol use: Yes    Comment:  social  . Drug use: No   Family History  Problem Relation Age of Onset  . Breast cancer Maternal Grandmother   . Colon cancer Paternal Grandmother 12  . Esophageal cancer Neg Hx   . Rectal cancer Neg Hx   . Stomach cancer Neg Hx    No Known Allergies  ROS Negative unless stated above    Objective:     BP (!) 148/82 (BP Location: Left Arm, Patient Position: Sitting, Cuff Size: Normal)   Pulse 75   Temp 98.6 F (37 C) (Oral)   Ht 6' (1.829 m)   Wt 266 lb 9.6 oz (120.9 kg)   SpO2 99%   BMI 36.16 kg/m  BP Readings from Last 3 Encounters:  03/31/24 (!) 148/82   03/03/24 (!) 158/86  07/10/23 (!) 161/97   Wt Readings from Last 3 Encounters:  03/31/24 266 lb 9.6 oz (120.9 kg)  03/03/24 264 lb 9.6 oz (120 kg)  07/10/23 251 lb (113.9 kg)      Physical Exam  Diabetic Foot Exam - Simple   Simple Foot Form Diabetic Foot exam was performed with the following findings: Yes 03/31/2024 10:09 AM  Visual Inspection No deformities, no ulcerations, no other skin breakdown bilaterally: Yes Sensation Testing Intact to touch and monofilament testing bilaterally: Yes Pulse Check Posterior Tibialis and Dorsalis pulse intact bilaterally: Yes Comments        03/31/2024   10:09 AM 03/03/2024    1:51 PM 05/24/2023   11:04 AM  Depression screen PHQ 2/9  Decreased Interest 1 1   Down, Depressed, Hopeless 1 1   PHQ - 2 Score 2 2   Altered sleeping 0 1   Tired, decreased energy 0 0   Change in appetite 0 0 0  Feeling bad or failure about yourself  1 1 0  Trouble concentrating 0 1 0  Moving slowly or fidgety/restless 0 1   Suicidal thoughts 0 0 0  PHQ-9 Score 3 6   Difficult doing work/chores Somewhat difficult  Not difficult at all      03/31/2024   10:09 AM 03/03/2024    1:51 PM 05/24/2023   11:04 AM 02/02/2023   10:59 AM  GAD 7 : Generalized Anxiety Score  Nervous, Anxious, on Edge 0 0 0 0  Control/stop worrying 0 0 0 0  Worry too much - different things 0 0 0 0  Trouble relaxing 0 1 0 0  Restless 1 1 0 0  Easily annoyed or irritable 1 1 0 0  Afraid - awful might happen 0 0 0 0  Total GAD 7 Score 2 3 0 0  Anxiety Difficulty Not difficult at all Not difficult at all Not difficult at all      No results found for any visits on 03/31/24.    Assessment & Plan:   Memory change -     Ambulatory referral to Neuropsychology  Essential hypertension -     amLODIPine  Besylate; Take 1 tablet (10 mg total) by mouth daily.  Dispense: 90 tablet; Refill: 3  Irritation of left eye  Type 2 diabetes mellitus without complication, without long-term  current use of insulin (HCC)    Return in about 4 months (around 07/31/2024).   Clotilda JONELLE Single, MD

## 2024-04-24 ENCOUNTER — Ambulatory Visit: Payer: Self-pay

## 2024-04-24 NOTE — Telephone Encounter (Signed)
Patient has appt 9/5

## 2024-04-24 NOTE — Telephone Encounter (Signed)
 FYI Only or Action Required?: FYI only for provider.  Patient was last seen in primary care on 03/31/2024 by Mercer Clotilda SAUNDERS, MD.  Called Nurse Triage reporting Leg Swelling.  Symptoms began last week .  Interventions attempted: Nothing.  Symptoms are: unchanged.  Triage Disposition: See Physician Within 24 Hours  Patient/caregiver understands and will follow disposition?: yes       Copied from CRM #[id number]. Topic: Clinical - Red Word Triage >> Apr 24, 2024 11:38 AM [name] wrote: Red Word that prompted transfer to Nurse Triage: Major swelling in lower legs from amLODipine  (NORVASC ) 10 MG tablet   ----------------------------------------------------------------------- From previous Reason for Contact - Pink Word Triage: Reason for Triage:    ----------------------------------------------------------------------- From previous Reason for Contact - Med. Prior Auth: Reason for CRM: Reason for Disposition  [1] MODERATE leg swelling (e.g., swelling extends up to knees) AND [2] new-onset or getting worse  Answer Assessment - Initial Assessment Questions 1. ONSET: When did the swelling start? (e.g., minutes, hours, days)     Last week  2. LOCATION: What part of the leg is swollen?  Are both legs swollen or just one leg?     Both legs R.L 3. SEVERITY: How bad is the swelling? (e.g., localized; mild, moderate, severe)     Mid calf  4. REDNESS: Is there redness or signs of infection?     no 5. PAIN: Is the swelling painful to touch? If Yes, ask: How painful is it?   (Scale 1-10; mild, moderate or severe)     no 6. FEVER: Do you have a fever? If Yes, ask: What is it, how was it measured, and when did it start?      no 7. CAUSE: What do you think is causing the leg swelling?     Amlodipine   10. OTHER SYMPTOMS: Do you have any other symptoms? (e.g., chest pain, difficulty breathing)       no  Protocols used: Leg Swelling and Edema-A-AH

## 2024-04-25 ENCOUNTER — Encounter: Payer: Self-pay | Admitting: Family Medicine

## 2024-04-25 ENCOUNTER — Ambulatory Visit: Admitting: Family Medicine

## 2024-04-25 VITALS — BP 122/78 | HR 101 | Temp 98.6°F | Wt 269.7 lb

## 2024-04-25 DIAGNOSIS — R600 Localized edema: Secondary | ICD-10-CM | POA: Diagnosis not present

## 2024-04-25 DIAGNOSIS — I10 Essential (primary) hypertension: Secondary | ICD-10-CM

## 2024-04-25 MED ORDER — LOSARTAN POTASSIUM-HCTZ 50-12.5 MG PO TABS: 1.0000 | ORAL_TABLET | Freq: Every day | ORAL | 3 refills | Status: AC

## 2024-04-25 MED ORDER — AMLODIPINE BESYLATE 5 MG PO TABS: 5.0000 mg | ORAL_TABLET | Freq: Every day | ORAL | 3 refills | Status: AC

## 2024-04-25 NOTE — Progress Notes (Signed)
 Established Patient Office Visit  Subjective   Patient ID: Andrew Gaines, male    DOB: 14-Nov-1965  Age: 58 y.o. MRN: [id number]  Chief Complaint  Patient presents with   Edema    HPI   Andrew Gaines is seen today as a work in with some bilateral lower extremity edema past few weeks.  Was just recently started amlodipine  10 mg daily.  Blood pressures are still been somewhat elevated by home readings but controlled here today.  He also takes losartan  100 mg daily.  No history of heart failure.  No orthopnea.  Edema worse late the day.  Increase in edema does correlate with initiation of amlodipine .  Past Medical History:  Diagnosis Date   Cancer (HCC) 06/2014   prostate cancer    Complication of anesthesia    n&v   DVT (deep venous thrombosis) (HCC)    and PE after knee surgery years ago   GERD (gastroesophageal reflux disease)    Hiatal hernia    PONV (postoperative nausea and vomiting)    Pre-diabetes    Sleep apnea    cpap   Unspecified essential hypertension    Unspecified sinusitis (chronic)    Past Surgical History:  Procedure Laterality Date   JOINT REPLACEMENT     bilateral knee    KNEE ARTHROSCOPY     bilateral    LYMPHADENECTOMY Bilateral 07/20/2014   Procedure: LYMPHADENECTOMY;  Surgeon: Gretel Ferrara, MD;  Location: [facility];  Service: Urology;  Laterality: Bilateral;   ROBOT ASSISTED LAPAROSCOPIC RADICAL PROSTATECTOMY N/A 07/20/2014   Procedure: ROBOTIC ASSISTED LAPAROSCOPIC RADICAL PROSTATECTOMY LEVEL 2;  Surgeon: Gretel Ferrara, MD;  Location: [facility];  Service: Urology;  Laterality: N/A;   SHOULDER ARTHROSCOPY     bilateral    thumb and elbow surgery      UMBILICAL HERNIA REPAIR N/A 07/10/2023   Procedure: HERNIA REPAIR UMBILICAL  AND INCISIONAL WITH MESH;  Surgeon: Eletha Boas, MD;  Location: [facility];  Service: General;  Laterality: N/A;    reports that he has never smoked. He has never used smokeless tobacco. He reports current alcohol use. He reports that  he does not use drugs. family history includes Breast cancer in his maternal grandmother; Colon cancer (age of onset: 63) in his paternal grandmother. No Known Allergies  Review of Systems  Constitutional:  Negative for malaise/fatigue.  Eyes:  Negative for blurred vision.  Respiratory:  Negative for shortness of breath.   Cardiovascular:  Positive for leg swelling. Negative for chest pain and orthopnea.  Neurological:  Negative for dizziness, weakness and headaches.      Objective:     BP 122/78 (BP Location: Left Arm, Cuff Size: Normal)   Pulse (!) 101   Temp 98.6 F (37 C) (Oral)   Wt 269 lb 11.2 oz (122.3 kg)   SpO2 95%   BMI 36.58 kg/m  BP Readings from Last 3 Encounters:  04/25/24 122/78  03/31/24 (!) 148/82  03/03/24 (!) 158/86   Wt Readings from Last 3 Encounters:  04/25/24 269 lb 11.2 oz (122.3 kg)  03/31/24 266 lb 9.6 oz (120.9 kg)  03/03/24 264 lb 9.6 oz (120 kg)      Physical Exam Vitals reviewed.  Constitutional:      Appearance: He is well-developed.  HENT:     Right Ear: External ear normal.     Left Ear: External ear normal.  Eyes:     Pupils: Pupils are equal, round, and reactive to light.  Neck:  Thyroid : No thyromegaly.  Cardiovascular:     Rate and Rhythm: Normal rate and regular rhythm.  Pulmonary:     Effort: Pulmonary effort is normal. No respiratory distress.     Breath sounds: Normal breath sounds. No wheezing or rales.  Musculoskeletal:     Cervical back: Neck supple.     Comments: Does have some trace pitting edema lower legs bilaterally  Neurological:     Mental Status: He is alert and oriented to person, place, and time.      No results found for any visits on 04/25/24.  Last CBC Lab Results  Component Value Date   WBC 5.5 03/03/2024   HGB 11.7 (L) 03/03/2024   HCT 36.5 (L) 03/03/2024   MCV 82.7 03/03/2024   MCH 27.7 07/09/2023   RDW 17.7 (H) 03/03/2024   PLT 213.0 03/03/2024   Last metabolic panel Lab Results   Component Value Date   GLUCOSE 139 (H) 03/03/2024   NA 139 03/03/2024   K 4.4 03/03/2024   CL 104 03/03/2024   CO2 27 03/03/2024   BUN 16 03/03/2024   CREATININE 1.13 03/03/2024   GFR 71.07 03/03/2024   CALCIUM  9.8 03/03/2024   PROT 6.8 03/03/2024   ALBUMIN 4.3 03/03/2024   BILITOT 0.9 03/03/2024   ALKPHOS 60 03/03/2024   AST 20 03/03/2024   ALT 27 03/03/2024   ANIONGAP 11 07/09/2023   Last lipids Lab Results  Component Value Date   CHOL 165 03/03/2024   HDL 67.00 03/03/2024   LDLCALC 57 03/03/2024   LDLDIRECT 138.0 01/31/2010   TRIG 207.0 (H) 03/03/2024   CHOLHDL 2 03/03/2024   Last hemoglobin A1c Lab Results  Component Value Date   HGBA1C 7.2 (H) 03/03/2024      The ASCVD Risk score (Arnett DK, et al., 2019) failed to calculate for the following reasons:   Risk score cannot be calculated because patient has a medical history suggesting prior/existing ASCVD    Assessment & Plan:   #1 bilateral leg edema following recent initiation of amlodipine .  He does have hypertension which is well-controlled by today's readings but still elevated by home readings.  We suggested the following modification to his medication regimen  -Discontinue amlodipine  10 mg and losartan  100 mg - Start amlodipine  5 mg daily and losartan /HCTZ 50/12.5 mg daily - Continue low-sodium diet - Elevate legs frequently - Set up follow-up with primary in about 6 weeks to reassess - Follow-up sooner for any progressive edema or other concerns  Wolm Scarlet, MD

## 2024-04-25 NOTE — Patient Instructions (Signed)
 Set up follow up with Dr Mercer in 6-8 weeks

## 2024-05-09 DIAGNOSIS — C61 Malignant neoplasm of prostate: Secondary | ICD-10-CM | POA: Diagnosis not present

## 2024-05-17 ENCOUNTER — Other Ambulatory Visit: Payer: Self-pay | Admitting: Family Medicine

## 2024-05-17 DIAGNOSIS — E559 Vitamin D deficiency, unspecified: Secondary | ICD-10-CM

## 2024-05-21 ENCOUNTER — Other Ambulatory Visit: Payer: Self-pay | Admitting: Family Medicine

## 2024-05-21 DIAGNOSIS — F339 Major depressive disorder, recurrent, unspecified: Secondary | ICD-10-CM

## 2024-05-28 NOTE — Progress Notes (Signed)
 Andrew Gaines                                          MRN: [id number]   05/28/2024   The [facility] Quality Team Specialist reviewed this patient medical record for the purposes of chart review for care gap closure. The following were reviewed: abstraction for care gap closure-controlling blood pressure.    [facility] Quality Team

## 2024-07-25 DIAGNOSIS — C61 Malignant neoplasm of prostate: Secondary | ICD-10-CM | POA: Diagnosis not present

## 2024-07-25 DIAGNOSIS — N5201 Erectile dysfunction due to arterial insufficiency: Secondary | ICD-10-CM | POA: Diagnosis not present

## 2024-07-28 DIAGNOSIS — M25512 Pain in left shoulder: Secondary | ICD-10-CM | POA: Diagnosis not present
